# Patient Record
Sex: Male | Born: 1985 | Race: White | Hispanic: No | Marital: Single | Smoking: Current every day smoker
Health system: Southern US, Community
[De-identification: ages and names within clinical notes are randomized; demographics above are authoritative.]

## PROBLEM LIST (undated history)

## (undated) DIAGNOSIS — F429 Obsessive-compulsive disorder, unspecified: Secondary | ICD-10-CM

## (undated) DIAGNOSIS — R45851 Suicidal ideations: Secondary | ICD-10-CM

## (undated) DIAGNOSIS — M1990 Unspecified osteoarthritis, unspecified site: Secondary | ICD-10-CM

## (undated) DIAGNOSIS — R569 Unspecified convulsions: Secondary | ICD-10-CM

## (undated) DIAGNOSIS — F909 Attention-deficit hyperactivity disorder, unspecified type: Secondary | ICD-10-CM

## (undated) DIAGNOSIS — F32A Depression, unspecified: Secondary | ICD-10-CM

## (undated) DIAGNOSIS — F329 Major depressive disorder, single episode, unspecified: Secondary | ICD-10-CM

## (undated) DIAGNOSIS — F1910 Other psychoactive substance abuse, uncomplicated: Secondary | ICD-10-CM

## (undated) DIAGNOSIS — G709 Myoneural disorder, unspecified: Secondary | ICD-10-CM

## (undated) HISTORY — DX: Unspecified osteoarthritis, unspecified site: M19.90

## (undated) HISTORY — DX: Other psychoactive substance abuse, uncomplicated: F19.10

## (undated) HISTORY — DX: Unspecified convulsions: R56.9

## (undated) HISTORY — DX: Myoneural disorder, unspecified: G70.9

---

## 2015-08-20 ENCOUNTER — Ambulatory Visit (INDEPENDENT_AMBULATORY_CARE_PROVIDER_SITE_OTHER): Payer: Self-pay

## 2015-08-20 ENCOUNTER — Ambulatory Visit (INDEPENDENT_AMBULATORY_CARE_PROVIDER_SITE_OTHER): Payer: Self-pay | Admitting: Urgent Care

## 2015-08-20 ENCOUNTER — Encounter (HOSPITAL_COMMUNITY): Payer: Self-pay | Admitting: Emergency Medicine

## 2015-08-20 VITALS — BP 126/98 | HR 103 | Temp 97.9°F | Resp 16 | Ht 73.25 in | Wt 179.0 lb

## 2015-08-20 DIAGNOSIS — F17210 Nicotine dependence, cigarettes, uncomplicated: Secondary | ICD-10-CM | POA: Insufficient documentation

## 2015-08-20 DIAGNOSIS — M1990 Unspecified osteoarthritis, unspecified site: Secondary | ICD-10-CM | POA: Insufficient documentation

## 2015-08-20 DIAGNOSIS — M7989 Other specified soft tissue disorders: Secondary | ICD-10-CM | POA: Insufficient documentation

## 2015-08-20 DIAGNOSIS — Z789 Other specified health status: Secondary | ICD-10-CM

## 2015-08-20 DIAGNOSIS — M79631 Pain in right forearm: Secondary | ICD-10-CM | POA: Insufficient documentation

## 2015-08-20 DIAGNOSIS — M79642 Pain in left hand: Secondary | ICD-10-CM

## 2015-08-20 DIAGNOSIS — Z880 Allergy status to penicillin: Secondary | ICD-10-CM | POA: Insufficient documentation

## 2015-08-20 DIAGNOSIS — M25531 Pain in right wrist: Secondary | ICD-10-CM

## 2015-08-20 DIAGNOSIS — F17200 Nicotine dependence, unspecified, uncomplicated: Secondary | ICD-10-CM

## 2015-08-20 DIAGNOSIS — R202 Paresthesia of skin: Secondary | ICD-10-CM | POA: Insufficient documentation

## 2015-08-20 DIAGNOSIS — Z7289 Other problems related to lifestyle: Secondary | ICD-10-CM

## 2015-08-20 DIAGNOSIS — R740 Nonspecific elevation of levels of transaminase and lactic acid dehydrogenase [LDH]: Secondary | ICD-10-CM | POA: Insufficient documentation

## 2015-08-20 DIAGNOSIS — Z8669 Personal history of other diseases of the nervous system and sense organs: Secondary | ICD-10-CM | POA: Insufficient documentation

## 2015-08-20 LAB — POCT CBC
GRANULOCYTE PERCENT: 69.4 % (ref 37–80)
HEMATOCRIT: 47.8 % (ref 43.5–53.7)
Hemoglobin: 16.6 g/dL (ref 14.1–18.1)
Lymph, poc: 1.8 (ref 0.6–3.4)
MCH: 36.2 pg — AB (ref 27–31.2)
MCHC: 34.7 g/dL (ref 31.8–35.4)
MCV: 104.3 fL — AB (ref 80–97)
MID (CBC): 0.4 (ref 0–0.9)
MPV: 7 fL (ref 0–99.8)
POC GRANULOCYTE: 5.1 (ref 2–6.9)
POC LYMPH %: 24.5 % (ref 10–50)
POC MID %: 6.1 % (ref 0–12)
Platelet Count, POC: 182 10*3/uL (ref 142–424)
RBC: 4.58 M/uL — AB (ref 4.69–6.13)
RDW, POC: 14.4 %
WBC: 7.3 10*3/uL (ref 4.6–10.2)

## 2015-08-20 MED ORDER — IBUPROFEN 400 MG PO TABS
400.0000 mg | ORAL_TABLET | Freq: Once | ORAL | Status: AC
  Administered 2015-08-20: 400 mg via ORAL

## 2015-08-20 MED ORDER — IBUPROFEN 400 MG PO TABS
ORAL_TABLET | ORAL | Status: AC
  Filled 2015-08-20: qty 1

## 2015-08-20 NOTE — Patient Instructions (Addendum)
Because you received an x-ray today, you will receive an invoice from [facility]. Please contact [facility] at [phone] with questions or concerns regarding your invoice. Our billing staff will not be able to assist you with those questions.   Please report to the [facility] ED for emergent evaluation of your hand swelling. The most important thing to rule out would be compartment syndrome which be due to infection versus inflammation or angioedema.

## 2015-08-20 NOTE — ED Notes (Signed)
Pt from [facility] for eval of swelling to left hand that started this morning and also swelling to right forearm. Pt denies any IV drug use. Pt denies n/v/d or fevers at this time. Pt states he burns his hands a lot at work. reddness and swelling noted to pt right hand. Pulses present.

## 2015-08-20 NOTE — Progress Notes (Signed)
MRN: [id number] DOB: 1986/06/20  Subjective:   Jerry Gray is a 30 y.o. male presenting for chief complaint of hand swelling and Rash  Reports 1 day history of worsening left hand pain, swelling and redness. Also has right forearm pain and swelling. Patient cannot recall any trauma but admits that he frequently gets small abrasions, cuts, burns at work; works at a [employer]. Denies fever, streaking of redness, animal bites, n/v, abdominal pain, chest pain. Denies history of known infection with MRSA. He does have tattoos on his forearms but none are recent. Smokes 1/2-1 ppd, drinks 2-6 beers per day.  Jerry Gray has a current medication list which includes the following prescription(s): clonazepam and zolpidem. Also is allergic to ciprofloxacin and penicillins.  Jerry Gray  has a past medical history of Arthritis; Substance abuse; Seizures (HCC); and Neuromuscular disorder (HCC). Also  has no past surgical history on file.  Mother has a history of rheumatoid arthritis.  Objective:   Vitals: BP 126/98 mmHg  Pulse 103  Temp(Src) 97.9 F (36.6 C)  Resp 16  Ht 6' 1.25" (1.861 m)  Wt 179 lb (81.194 kg)  BMI 23.44 kg/m2  SpO2 98%  Physical Exam  Constitutional: He is oriented to person, place, and time. He appears well-developed and well-nourished.  HENT:  Mouth/Throat: Oropharynx is clear and moist.  Eyes: No scleral icterus.  Cardiovascular: Normal rate, regular rhythm and intact distal pulses.  Exam reveals no gallop and no friction rub.   No murmur heard. Pulmonary/Chest: No respiratory distress. He has no wheezes. He has no rales.  Musculoskeletal:       Right forearm: He exhibits tenderness (over area depicted) and swelling (over area depicted). He exhibits no bony tenderness, no deformity and no laceration.       Arms:      Left hand: He exhibits decreased range of motion (flexion), tenderness (worst over radial aspect of hand near base of thumb) and swelling (over area depicted). He  exhibits no bony tenderness, normal capillary refill, no deformity and no laceration. Normal sensation noted. Normal strength noted.       Hands: Left 2nd-5th left digits have pallor.  Lymphadenopathy:    He has no cervical adenopathy.  Neurological: He is alert and oriented to person, place, and time.   Dg Forearm Right  08/20/2015  CLINICAL DATA:  Right forearm pain EXAM: RIGHT FOREARM - 2 VIEW COMPARISON:  None in PACs FINDINGS: The bones of the right forearm are adequately mineralized. There is no acute fracture nor dislocation. There is no lytic nor blastic lesion. The soft tissues are unremarkable. IMPRESSION: There is no acute bony or soft tissue abnormality of the right forearm. Electronically Signed   By: David  [place] M.D.   On: 08/20/2015 14:04   Dg Hand Complete Left  08/20/2015  CLINICAL DATA:  Left hand pain and swelling and erythema ; no report of injury. EXAM: LEFT HAND - COMPLETE 3+ VIEW COMPARISON:  None in PACs FINDINGS: The bones of the left hand are adequately mineralized. The joint spaces are preserved. There is no acute fracture nor dislocation. There is no lytic nor blastic lesion. The observed portions of the carpal bones and distal radius and ulna appear normal. There is soft tissue swelling over the dorsum of the hand. No foreign bodies or soft tissue gas collections are observed. IMPRESSION: There is no acute or chronic bony abnormality of the left hand. There is soft tissue swellingover the carpal bones especially dorsally. Electronically Signed  By: David  [place] M.D.   On: 08/20/2015 14:03   Results for orders placed or performed in visit on 08/20/15 (from the past 24 hour(s))  POCT CBC     Status: Abnormal   Collection Time: 08/20/15  1:38 PM  Result Value Ref Range   WBC 7.3 4.6 - 10.2 K/uL   Lymph, poc 1.8 0.6 - 3.4   POC LYMPH PERCENT 24.5 10 - 50 %L   MID (cbc) 0.4 0 - 0.9   POC MID % 6.1 0 - 12 %M   POC Granulocyte 5.1 2 - 6.9   Granulocyte percent 69.4 37  - 80 %G   RBC 4.58 (A) 4.69 - 6.13 M/uL   Hemoglobin 16.6 14.1 - 18.1 g/dL   HCT, POC 16.1 09.6 - 53.7 %   MCV 104.3 (A) 80 - 97 fL   MCH, POC 36.2 (A) 27 - 31.2 pg   MCHC 34.7 31.8 - 35.4 g/dL   RDW, POC 04.5 %   Platelet Count, POC 182 142 - 424 K/uL   MPV 7.0 0 - 99.8 fL   Assessment and Plan :   This case was precepted with Dr. Cleta Alberts and Dr. Alwyn Ren.  1. Swelling of left hand 2. Left hand pain 3. Forearm joint pain, right 4. Pain and swelling of right forearm - Symptoms concerning for compartment syndrome secondary to infectious versus inflammatory process versus angioedema. Will send to hand specialist for emergent consult. Fax notes to Dr. Amanda Pea at [phone].  5. Tobacco use disorder 6. Alcohol use (HCC) - Cmet pending  Wallis Bamberg, PA-C [facility] [phone] 08/20/2015 1:35 PM

## 2015-08-21 ENCOUNTER — Emergency Department (HOSPITAL_COMMUNITY)
Admission: EM | Admit: 2015-08-21 | Discharge: 2015-08-21 | Disposition: A | Discharge status: Home / Self Care | Payer: Self-pay | Attending: Emergency Medicine | Admitting: Emergency Medicine

## 2015-08-21 ENCOUNTER — Emergency Department (HOSPITAL_COMMUNITY): Payer: Self-pay

## 2015-08-21 ENCOUNTER — Encounter (HOSPITAL_COMMUNITY): Payer: Self-pay | Admitting: Radiology

## 2015-08-21 DIAGNOSIS — M7989 Other specified soft tissue disorders: Secondary | ICD-10-CM

## 2015-08-21 DIAGNOSIS — M79642 Pain in left hand: Secondary | ICD-10-CM

## 2015-08-21 DIAGNOSIS — R7401 Elevation of levels of liver transaminase levels: Secondary | ICD-10-CM

## 2015-08-21 DIAGNOSIS — R740 Nonspecific elevation of levels of transaminase and lactic acid dehydrogenase [LDH]: Secondary | ICD-10-CM

## 2015-08-21 LAB — COMPREHENSIVE METABOLIC PANEL
ALBUMIN: 4.2 g/dL (ref 3.6–5.1)
ALK PHOS: 82 U/L (ref 40–115)
ALT: 239 U/L — ABNORMAL HIGH (ref 9–46)
AST: 559 U/L — AB (ref 10–40)
BILIRUBIN TOTAL: 1.1 mg/dL (ref 0.2–1.2)
BUN: 11 mg/dL (ref 7–25)
CALCIUM: 8.8 mg/dL (ref 8.6–10.3)
CO2: 28 mmol/L (ref 20–31)
Chloride: 97 mmol/L — ABNORMAL LOW (ref 98–110)
Creat: 0.7 mg/dL (ref 0.60–1.35)
Glucose, Bld: 77 mg/dL (ref 65–99)
POTASSIUM: 3.9 mmol/L (ref 3.5–5.3)
Sodium: 135 mmol/L (ref 135–146)
Total Protein: 6.9 g/dL (ref 6.1–8.1)

## 2015-08-21 MED ORDER — OXYCODONE-ACETAMINOPHEN 5-325 MG PO TABS
1.0000 | ORAL_TABLET | Freq: Once | ORAL | Status: AC
  Administered 2015-08-21: 1 via ORAL
  Filled 2015-08-21: qty 1

## 2015-08-21 MED ORDER — OXYCODONE HCL 5 MG PO TABS: 5.0000 mg | ORAL_TABLET | ORAL | Status: DC | PRN

## 2015-08-21 MED ORDER — IOHEXOL 300 MG/ML  SOLN
100.0000 mL | Freq: Once | INTRAMUSCULAR | Status: AC | PRN
  Administered 2015-08-21: 100 mL via INTRAVENOUS

## 2015-08-21 NOTE — ED Notes (Signed)
Glick, MD at bedside.  

## 2015-08-21 NOTE — ED Provider Notes (Signed)
CSN: [id number]     Arrival date & time 08/20/15  1729 History  By signing my name below, I, Jerry Gray, attest that this documentation has been prepared under the direction and in the presence of Jerry Booze, MD . Electronically Signed: Marisue Gray, Scribe. 08/21/2015. 2:46 AM.   Chief Complaint  Patient presents with  . Arm Swelling   The history is provided by the patient. No language interpreter was used.   HPI Comments:  Jerry Gray is a 30 y.o. male referred by [facility] who presents to the Emergency Department complaining of worsening left hand and forearm swelling and 8/10 pain onset two days ago upon waking. He notes the swelling limits his range of motion and strength. Pt also reports tingling in right fingers and soreness in right forearm. Pt notes pain is worse when sitting still. He states he burnt his left hand on a grill a week ago; he popped the resulting blister and noticed clear liquid drainage. Pt has been applying ice to the left hand with mild relief; pt took Tylenol and Ibuprofen yesterday with mild relief. Pt reports smoking cigarettes 1 pack/day, drinking 3-4 beers per day, and using marajuana. Pt denies other drug use. Pt denies fever, chills, sweating, and any other symptoms at this time.   Past Medical History  Diagnosis Date  . Arthritis   . Substance abuse   . Seizures (HCC)   . Neuromuscular disorder (HCC)    History reviewed. No pertinent past surgical history. No family history on file. Social History  Substance Use Topics  . Smoking status: Current Every Day Smoker  . Smokeless tobacco: None  . Alcohol Use: 0.0 oz/week    0 Standard drinks or equivalent per week    Review of Systems  Constitutional: Negative for chills, diaphoresis and fatigue.  Musculoskeletal: Positive for joint swelling and arthralgias.  Neurological: Positive for weakness and numbness.  All other systems reviewed and are negative.  Allergies  Ciprofloxacin and  Penicillins  Home Medications   Prior to Admission medications   Medication Sig Start Date End Date Taking? Authorizing Provider  clonazePAM (KLONOPIN) 1 MG tablet Take 1 mg by mouth 3 (three) times daily as needed for anxiety.    Yes Historical Provider, MD  ibuprofen (ADVIL,MOTRIN) 200 MG tablet Take 400-800 mg by mouth every 6 (six) hours as needed for moderate pain.   Yes Historical Provider, MD  zolpidem (AMBIEN) 10 MG tablet Take 10 mg by mouth at bedtime as needed for sleep.   Yes Historical Provider, MD   BP 131/102 mmHg  Pulse 63  Temp(Src) 98 F (36.7 C) (Oral)  Resp 16  SpO2 92% Physical Exam  Constitutional: He is oriented to person, place, and time. He appears well-developed and well-nourished. No distress.  HENT:  Head: Normocephalic and atraumatic.  Right Ear: Hearing normal.  Left Ear: Hearing normal.  Mouth/Throat: Oropharynx is clear and moist and mucous membranes are normal.  Eyes: Conjunctivae and EOM are normal. Pupils are equal, round, and reactive to light.  Neck: Normal range of motion. Neck supple. No JVD present.  Cardiovascular: S1 normal and S2 normal.  Exam reveals no gallop and no friction rub.   No murmur heard. Pulmonary/Chest: Effort normal and breath sounds normal. He has no wheezes. He has no rales. He exhibits no tenderness.  Abdominal: Soft. Normal appearance and bowel sounds are normal. He exhibits no distension and no mass. There is no hepatosplenomegaly. There is no tenderness. There is no tenderness  at McBurney's point and negative Murphy's sign. No hernia.  Musculoskeletal: He exhibits edema.  Left hand is moderately swollen diffusely with swelling worst over thenar; hand is sligtly cool with slight pallor but prompt capilary refill; full passive ROM; mild pain with flexion and extention of left thumb  Lymphadenopathy:    He has no cervical adenopathy.  Neurological: He is alert and oriented to person, place, and time. He has normal strength.  No cranial nerve deficit or sensory deficit. He exhibits normal muscle tone. Coordination normal. GCS eye subscore is 4. GCS verbal subscore is 5. GCS motor subscore is 6.  Skin: Skin is warm, dry and intact. No rash noted. No cyanosis.  Psychiatric: He has a normal mood and affect. His speech is normal and behavior is normal. Judgment and thought content normal.  Nursing note and vitals reviewed.  ED Course  Procedures  DIAGNOSTIC STUDIES:  Oxygen Saturation is 100% on RA, normal by my interpretation.    COORDINATION OF CARE:  12:44 AM Will order CT and administer pain medication. Discussed treatment plan with pt at bedside and pt agreed to plan.  Labs Review Labs Reviewed - No data to display  Imaging Review Dg Forearm Right  08/20/2015  CLINICAL DATA:  Right forearm pain EXAM: RIGHT FOREARM - 2 VIEW COMPARISON:  None in PACs FINDINGS: The bones of the right forearm are adequately mineralized. There is no acute fracture nor dislocation. There is no lytic nor blastic lesion. The soft tissues are unremarkable. IMPRESSION: There is no acute bony or soft tissue abnormality of the right forearm. Electronically Signed   By: Aeric Burnham  [place] M.D.   On: 08/20/2015 14:04   Ct Hand Left W Contrast  08/21/2015  CLINICAL DATA:  Burned left hand on grill one week ago, with left hand swelling and tingling. Assess for cellulitis or compartment syndrome. Initial encounter. EXAM: CT OF THE LEFT HAND WITHOUT CONTRAST TECHNIQUE: Multidetector CT imaging of the left hand was performed according to the standard protocol. Multiplanar CT image reconstructions were also generated. COMPARISON:  Left hand radiographs performed 08/20/2015 FINDINGS: Mild soft tissue swelling is noted about the hand, Gray prominent dorsally. No focal fluid collection is seen to suggest abscess. The visualized flexor and extensor tendons are grossly unremarkable in appearance. The carpal tunnel is grossly unremarkable in appearance. The  visualized vasculature is unremarkable in appearance. There is no evidence of fracture. No osseous erosion is seen. No definite muscle abnormalities are seen to suggest compartment syndrome, though this tends to be a clinical diagnosis, and is often not visible on CT. IMPRESSION: Mild soft tissue swelling about the hand, Gray prominent dorsally. No evidence of abscess. No definite muscle abnormality seen to suggest compartment syndrome, though this tends to be a clinical diagnosis, and is often not visible on CT. Electronically Signed   By: Roanna Raider M.D.   On: 08/21/2015 02:38   Dg Hand Complete Left  08/20/2015  CLINICAL DATA:  Left hand pain and swelling and erythema ; no report of injury. EXAM: LEFT HAND - COMPLETE 3+ VIEW COMPARISON:  None in PACs FINDINGS: The bones of the left hand are adequately mineralized. The joint spaces are preserved. There is no acute fracture nor dislocation. There is no lytic nor blastic lesion. The observed portions of the carpal bones and distal radius and ulna appear normal. There is soft tissue swelling over the dorsum of the hand. No foreign bodies or soft tissue gas collections are observed. IMPRESSION: There is no  acute or chronic bony abnormality of the left hand. There is soft tissue swellingover the carpal bones especially dorsally. Electronically Signed   By: Arben Packman  [place] M.D.   On: 08/20/2015 14:03   I have personally reviewed and evaluated these images and lab results as part of my medical decision-making.  MDM   Final diagnoses:  Swelling of left hand  Pain of left hand  Elevated transaminase level    Swelling of left hand of uncertain cause. No evidence of vascular compromise with prompt capillary refill. He was sent for CT to look for evidence of any underlying process and this is unremarkable. Old records are reviewed and he was seen at urgent care prior to coming to the ED. He was specifically sent here. Exam shows no evidence of compartment  syndrome. Laboratory workup is significant for significant elevation of transaminases which is most likely related to his history of excessive alcohol intake. Patient is advised to stop drinking. [facility] for further evaluation of his elevated transaminases and he is referred to hand surgery for follow-up of his hand swelling. He is discharged with prescription for oxycodone.  I personally performed the services described in this documentation, which was scribed in my presence. The recorded information has been reviewed and is accurate.      Jerry Booze, MD 08/21/15 [phone]

## 2015-08-21 NOTE — Discharge Instructions (Signed)
Call the hand specialist in the morning to get a an appointment as soon as possible. Return if symptoms are getting worse.  Stop drinking. It is harming your liver.   Oxycodone tablets or capsules What is this medicine? OXYCODONE (ox i KOE done) is a pain reliever. It is used to treat moderate to severe pain. This medicine may be used for other purposes; ask your health care provider or pharmacist if you have questions. What should I tell my health care provider before I take this medicine? They need to know if you have any of these conditions: -Addison's disease -brain tumor -head injury -heart disease -history of drug or alcohol abuse problem -if you often drink alcohol -kidney disease -liver disease -lung or breathing disease, like asthma -mental illness -pancreatic disease -seizures -thyroid disease -an unusual or allergic reaction to oxycodone, codeine, hydrocodone, morphine, other medicines, foods, dyes, or preservatives -pregnant or trying to get pregnant -breast-feeding How should I use this medicine? Take this medicine by mouth with a glass of water. Follow the directions on the prescription label. You can take it with or without food. If it upsets your stomach, take it with food. Take your medicine at regular intervals. Do not take it more often than directed. Do not stop taking except on your doctor's advice. Some brands of this medicine, like Oxecta, have special instructions. Ask your doctor or pharmacist if these directions are for you: Do not cut, crush or chew this medicine. Swallow only one tablet at a time. Do not wet, soak, or lick the tablet before you take it. Talk to your pediatrician regarding the use of this medicine in children. Special care may be needed. Overdosage: If you think you have taken too much of this medicine contact a poison control center or emergency room at once. NOTE: This medicine is only for you. Do not share this medicine with others. What  if I miss a dose? If you miss a dose, take it as soon as you can. If it is almost time for your next dose, take only that dose. Do not take double or extra doses. What may interact with this medicine? -alcohol -antihistamines -certain medicines used for nausea like chlorpromazine, droperidol -erythromycin -ketoconazole -medicines for depression, anxiety, or psychotic disturbances -medicines for sleep -muscle relaxants -naloxone -naltrexone -narcotic medicines (opiates) for pain -nilotinib -phenobarbital -phenytoin -rifampin -ritonavir -voriconazole This list may not describe all possible interactions. Give your health care provider a list of all the medicines, herbs, non-prescription drugs, or dietary supplements you use. Also tell them if you smoke, drink alcohol, or use illegal drugs. Some items may interact with your medicine. What should I watch for while using this medicine? Tell your doctor or health care professional if your pain does not go away, if it gets worse, or if you have new or a different type of pain. You may develop tolerance to the medicine. Tolerance means that you will need a higher dose of the medicine for pain relief. Tolerance is normal and is expected if you take this medicine for a long time. Do not suddenly stop taking your medicine because you may develop a severe reaction. Your body becomes used to the medicine. This does NOT mean you are addicted. Addiction is a behavior related to getting and using a drug for a non-medical reason. If you have pain, you have a medical reason to take pain medicine. Your doctor will tell you how much medicine to take. If your doctor wants you to  stop the medicine, the dose will be slowly lowered over time to avoid any side effects. You may get drowsy or dizzy when you first start taking this medicine or change doses. Do not drive, use machinery, or do anything that may be dangerous until you know how the medicine affects you.  Stand or sit up slowly. There are different types of narcotic medicines (opiates) for pain. If you take more than one type at the same time, you may have more side effects. Give your health care provider a list of all medicines you use. Your doctor will tell you how much medicine to take. Do not take more medicine than directed. Call emergency for help if you have problems breathing. This medicine will cause constipation. Try to have a bowel movement at least every 2 to 3 days. If you do not have a bowel movement for 3 days, call your doctor or health care professional. Your mouth may get dry. Drinking water, chewing sugarless gum, or sucking on hard candy may help. See your dentist every 6 months. What side effects may I notice from receiving this medicine? Side effects that you should report to your doctor or health care professional as soon as possible: -allergic reactions like skin rash, itching or hives, swelling of the face, lips, or tongue -breathing problems -confusion -feeling faint or lightheaded, falls -trouble passing urine or change in the amount of urine -unusually weak or tired Side effects that usually do not require medical attention (report to your doctor or health care professional if they continue or are bothersome): -constipation -dry mouth -itching -nausea, vomiting -upset stomach This list may not describe all possible side effects. Call your doctor for medical advice about side effects. You may report side effects to FDA at 1-800-FDA-1088. Where should I keep my medicine? Keep out of the reach of children. This medicine can be abused. Keep your medicine in a safe place to protect it from theft. Do not share this medicine with anyone. Selling or giving away this medicine is dangerous and against the law. Store at room temperature between 15 and 30 degrees C (59 and 86 degrees F). Protect from light. Keep container tightly closed. This medicine may cause accidental  overdose and death if it is taken by other adults, children, or pets. Flush any unused medicine down the toilet to reduce the chance of harm. Do not use the medicine after the expiration date. NOTE: This sheet is a summary. It may not cover all possible information. If you have questions about this medicine, talk to your doctor, pharmacist, or health care provider.    2016, [employer] Standard. (2014-10-28 01:15:14)

## 2015-08-22 ENCOUNTER — Telehealth: Payer: Self-pay

## 2015-08-22 ENCOUNTER — Ambulatory Visit (HOSPITAL_BASED_OUTPATIENT_CLINIC_OR_DEPARTMENT_OTHER)
Admission: RE | Admit: 2015-08-22 | Discharge: 2015-08-22 | Disposition: A | Discharge status: Home / Self Care | Payer: Self-pay | Source: Ambulatory Visit | Attending: Physician Assistant | Admitting: Physician Assistant

## 2015-08-22 ENCOUNTER — Ambulatory Visit (INDEPENDENT_AMBULATORY_CARE_PROVIDER_SITE_OTHER): Payer: Self-pay | Admitting: Family Medicine

## 2015-08-22 VITALS — BP 143/96 | HR 93 | Temp 98.1°F | Resp 16 | Ht 73.0 in | Wt 178.0 lb

## 2015-08-22 DIAGNOSIS — R2231 Localized swelling, mass and lump, right upper limb: Secondary | ICD-10-CM

## 2015-08-22 DIAGNOSIS — M79631 Pain in right forearm: Secondary | ICD-10-CM

## 2015-08-22 DIAGNOSIS — R945 Abnormal results of liver function studies: Secondary | ICD-10-CM

## 2015-08-22 DIAGNOSIS — M7989 Other specified soft tissue disorders: Secondary | ICD-10-CM

## 2015-08-22 DIAGNOSIS — R7989 Other specified abnormal findings of blood chemistry: Secondary | ICD-10-CM

## 2015-08-22 DIAGNOSIS — M79632 Pain in left forearm: Secondary | ICD-10-CM

## 2015-08-22 DIAGNOSIS — R2232 Localized swelling, mass and lump, left upper limb: Secondary | ICD-10-CM

## 2015-08-22 DIAGNOSIS — F1010 Alcohol abuse, uncomplicated: Secondary | ICD-10-CM

## 2015-08-22 LAB — POCT SEDIMENTATION RATE: POCT SED RATE: 4 mm/hr (ref 0–22)

## 2015-08-22 NOTE — Patient Instructions (Signed)
Go to [facility] and register at radiology for outpatient US venous doppler.

## 2015-08-22 NOTE — Progress Notes (Signed)
[facility] [address] [place] [address] [phone]  Date:  08/22/2015   Name:  Jerry Gray   DOB:  1985/07/18   MRN:  [id number]  PCP:  Default, Provider, MD    Chief Complaint: Follow-up   History of Present Illness:  This is a 30 y.o. male with PMH alcohol abuse who is presenting with 3 days of left and right hand/forearm swelling. States he woke 3 days ago with a very large left hand. Right hand moderately swollen as well but that went down. Was having pain initially, this is mostly gone now. Now left hand is feeling numb, esp at thumb. He was seen here 2/20 with concern of compartment syndrome vs inflammatory process vs angioedema. CBC wnl and Cmet with markedly increased LFTs - AST 559 and ALT 239. He was sent to ED. Xray left forearm negative. CT left hand negative. Discharged with oxycodone and referral to hand surgery. It has been about 24 hours since discharge -- he states the swelling has resolved in right hand. It is going down some in left hand. He feels he is getting some of his ROM back in his digits. He noticed a new swelling of his left forearm. He states his right forearm feels very stiff.  No hx blood clot. No fam hx blood clot. No recent travel or immobilization.  Pt states prior to his symptoms beginning he was "going hard" with friends who were in town. He states at baseline he drinks 1 bottle of wine and 2 beers per day. He states he used to drink a fifth of liquor per day but has cut back. He smokes marijuana on a regular basis. No other recent drug use. He states he has used IV drugs about 25 times before, last used 1 year ago. He has a lot of tattoos, most of which he got "at people's houses". He states he got hepatitis series in recent past d/t getting tattoos at people's houses instead of recognized tattoo parlors.   Pt is very worried about finances. He is uninsured.   Review of Systems:  Review of Systems See HPI  There are no  active problems to display for this patient.   Prior to Admission medications   Medication Sig Start Date End Date Taking? Authorizing Provider  clonazePAM (KLONOPIN) 1 MG tablet Take 1 mg by mouth 3 (three) times daily as needed for anxiety.    Yes Historical Provider, MD  ibuprofen (ADVIL,MOTRIN) 200 MG tablet Take 400-800 mg by mouth every 6 (six) hours as needed for moderate pain.   Yes Historical Provider, MD  oxyCODONE (ROXICODONE) 5 MG immediate release tablet Take 1-2 tablets (5-10 mg total) by mouth every 4 (four) hours as needed for moderate pain or severe pain. 08/21/15  Yes Dione Booze, MD  zolpidem (AMBIEN) 10 MG tablet Take 10 mg by mouth at bedtime as needed for sleep.   Yes Historical Provider, MD    Allergies  Allergen Reactions  . Ciprofloxacin Diarrhea and Nausea And Vomiting  . Penicillins Anaphylaxis    History reviewed. No pertinent past surgical history.  Social History  Substance Use Topics  . Smoking status: Current Every Day Smoker  . Smokeless tobacco: None  . Alcohol Use: 0.0 oz/week    0 Standard drinks or equivalent per week    History reviewed. No pertinent family history.  Medication list has been reviewed and updated.  Physical Examination:  Physical Exam  Constitutional: He is oriented  to person, place, and time. He appears well-developed and well-nourished. No distress.  HENT:  Head: Normocephalic and atraumatic.  Right Ear: Hearing normal.  Left Ear: Hearing normal.  Nose: Nose normal.  Eyes: Conjunctivae and lids are normal. Right eye exhibits no discharge. Left eye exhibits no discharge. No scleral icterus. Right eye exhibits nystagmus. Left eye exhibits nystagmus.  Cardiovascular: Normal rate, regular rhythm, normal heart sounds and normal pulses.   No murmur heard. Pulmonary/Chest: Effort normal and breath sounds normal. No respiratory distress. He has no wheezes. He has no rhonchi. He has no rales.  Musculoskeletal:       Right  shoulder: Normal.       Left shoulder: Normal.       Right elbow: Normal.      Left elbow: Normal.       Right wrist: Normal.       Left wrist: He exhibits normal range of motion and no tenderness.       Right upper arm: Normal.       Left upper arm: Normal.       Right forearm: He exhibits swelling (measures 30 cm. Forearm is tense and firm). He exhibits no tenderness.       Left forearm: He exhibits swelling (mild, dorsal forearm. Measures 28 cm). He exhibits no tenderness.       Right hand: Normal.       Left hand: He exhibits decreased range of motion (decreased flexion and extension of digits d/t swelling) and swelling (entire hand swelling, stops at wrist.). He exhibits no tenderness and normal capillary refill. Decreased sensation (thumb) noted. Decreased strength noted. He exhibits thumb/finger opposition.  4/5 left wrist strength with extension and flexion 5/5 right wrist strength Unable to touch left 4th and 5th digits to thumb. Decreased strength with finger opposition, 2-3/5.  Lymphadenopathy:       Head (right side): No submental, no submandibular and no tonsillar adenopathy present.       Head (left side): No submental, no submandibular and no tonsillar adenopathy present.    He has no cervical adenopathy.    He has no axillary adenopathy.  Neurological: He is alert and oriented to person, place, and time.  Skin: Skin is warm, dry and intact. No lesion and no rash noted.  No skin changes  Psychiatric: He has a normal mood and affect. His speech is normal and behavior is normal. Thought content normal.   BP 143/96 mmHg  Pulse 93  Temp(Src) 98.1 F (36.7 C) (Oral)  Resp 16  Ht  (1.854 m)  Wt 178 lb (80.74 kg)  BMI 23.49 kg/m2  SpO2 99%   Results for orders placed or performed in visit on 08/22/15  POCT SEDIMENTATION RATE  Result Value Ref Range   POCT SED RATE 4 0 - 22 mm/hr   BILATERAL UPPER EXT DOPPLER IMPRESSION: No evidence of deep venous thrombosis in  either upper extremity.  Heterogeneous masslike lesion in the right mid forearm. Although this is incompletely evaluated on this exam, it measures approximately 9 x 3 x 5 cm and does show some internal blood flow on color Doppler ultrasound. Differential diagnosis includes neoplasm, hematoma comment abscess. Recommend clinical correlation, and consider upper extremity MRI without and with contrast for further evaluation if clinically warranted.  Assessment and Plan:  1. Pain and swelling of right forearm 2. Swelling of left hand 3. Mass of forearm, right Swelling concerning for possible DVT -- upper ext doppler showed no  clots but did show a large mass in right forearm. Pt sent for MR of right arm for further eval. Sed rate in normal range. - US Venous Img Upper Bilat; Future - MR Forearm Right Wo/W Cm; Future - POCT SEDIMENTATION RATE - CBC - C-reactive protein - Hepatic function panel  4. Alcohol abuse 5. Elevated LFTs We discussed the importance of alcohol cessation d/t significant inflammation of liver. Advised getting tested for hep b or hep c but patient declined at this time d/t financial concerns.   Roswell Miners Dyke Brackett, MHS [facility]  08/24/2015

## 2015-08-22 NOTE — Telephone Encounter (Signed)
[facility] in [facility] is calling because they just spoke with someone about patient getting an ultrasound and wants [place] to know that it won't be until 6:30 today. The lady wanted to tell us before the patient headed over today. Please inform patient!

## 2015-08-23 ENCOUNTER — Ambulatory Visit (HOSPITAL_COMMUNITY)
Admission: RE | Admit: 2015-08-23 | Discharge: 2015-08-23 | Disposition: A | Discharge status: Home / Self Care | Payer: Self-pay | Source: Ambulatory Visit | Attending: Family Medicine | Admitting: Family Medicine

## 2015-08-23 ENCOUNTER — Telehealth: Payer: Self-pay | Admitting: *Deleted

## 2015-08-23 ENCOUNTER — Inpatient Hospital Stay (HOSPITAL_COMMUNITY): Admission: RE | Admit: 2015-08-23 | Payer: Self-pay | Source: Ambulatory Visit

## 2015-08-23 DIAGNOSIS — M6289 Other specified disorders of muscle: Secondary | ICD-10-CM | POA: Insufficient documentation

## 2015-08-23 DIAGNOSIS — R2231 Localized swelling, mass and lump, right upper limb: Secondary | ICD-10-CM | POA: Insufficient documentation

## 2015-08-23 DIAGNOSIS — M7989 Other specified soft tissue disorders: Secondary | ICD-10-CM | POA: Insufficient documentation

## 2015-08-23 DIAGNOSIS — M79631 Pain in right forearm: Secondary | ICD-10-CM | POA: Insufficient documentation

## 2015-08-23 MED ORDER — GADOBENATE DIMEGLUMINE 529 MG/ML IV SOLN
20.0000 mL | Freq: Once | INTRAVENOUS | Status: AC | PRN
  Administered 2015-08-23: 16 mL via INTRAVENOUS

## 2015-08-23 NOTE — Telephone Encounter (Signed)
Was sent to [facility] who was full and then sent to [facility] who can't get him in to 9 pm. MRI tech suspect GI is full today and thinks 9 pm is likely the first MRI open but does not [facility] [place] - please check there and if pt can be seen at a more reasonable time there then call [facility] to cancel the 9 pm slot and let pt know. Thanks. eva

## 2015-08-23 NOTE — Progress Notes (Signed)
Patient ID: Jerry Gray, male   DOB: 02-Mar-1986, 30 y.o.   MRN: [id number] Pt assessed independently, reviewed documentation and agree w/ assessment and plan.  Results for orders placed or performed in visit on 08/22/15  POCT SEDIMENTATION RATE  Result Value Ref Range   POCT SED RATE 4 0 - 22 mm/hr   Dg Forearm Right  08/20/2015  CLINICAL DATA:  Right forearm pain EXAM: RIGHT FOREARM - 2 VIEW COMPARISON:  None in PACs FINDINGS: The bones of the right forearm are adequately mineralized. There is no acute fracture nor dislocation. There is no lytic nor blastic lesion. The soft tissues are unremarkable. IMPRESSION: There is no acute bony or soft tissue abnormality of the right forearm. Electronically Signed   By: David  Sw[place] M.D.   On: 08/20/2015 14:04   Ct Hand Left W Contrast  08/21/2015  CLINICAL DATA:  Burned left hand on grill one week ago, with left hand swelling and tingling. Assess for cellulitis or compartment syndrome. Initial encounter. EXAM: CT OF THE LEFT HAND WITHOUT CONTRAST TECHNIQUE: Multidetector CT imaging of the left hand was performed according to the standard protocol. Multiplanar CT image reconstructions were also generated. COMPARISON:  Left hand radiographs performed 08/20/2015 FINDINGS: Mild soft tissue swelling is noted about the hand, more prominent dorsally. No focal fluid collection is seen to suggest abscess. The visualized flexor and extensor tendons are grossly unremarkable in appearance. The carpal tunnel is grossly unremarkable in appearance. The visualized vasculature is unremarkable in appearance. There is no evidence of fracture. No osseous erosion is seen. No definite muscle abnormalities are seen to suggest compartment syndrome, though this tends to be a clinical diagnosis, and is often not visible on CT. IMPRESSION: Mild soft tissue swelling about the hand, more prominent dorsally. No evidence of abscess. No definite muscle abnormality seen to suggest compartment  syndrome, though this tends to be a clinical diagnosis, and is often not visible on CT. Electronically Signed   By: Roanna Raider M.D.   On: 08/21/2015 02:38   US Venous Img Upper Bilat  08/22/2015  CLINICAL DATA:  Right forearm and left hand swelling and pain for 4 days. EXAM: BILATERAL UPPER EXTREMITY VENOUS DOPPLER ULTRASOUND TECHNIQUE: Gray-scale sonography with graded compression, as well as color Doppler and duplex ultrasound were performed to evaluate the bilateral upper extremity deep venous systems from the level of the subclavian vein and including the jugular, axillary, basilic, radial, ulnar and upper cephalic vein. Spectral Doppler was utilized to evaluate flow at rest and with distal augmentation maneuvers. COMPARISON:  None. FINDINGS: RIGHT UPPER EXTREMITY Internal Jugular Vein: No evidence of thrombus. Normal compressibility, respiratory phasicity and response to augmentation. Subclavian Vein: No evidence of thrombus. Normal compressibility, respiratory phasicity and response to augmentation. Axillary Vein: No evidence of thrombus. Normal compressibility, respiratory phasicity and response to augmentation. Cephalic Vein: No evidence of thrombus. Normal compressibility, respiratory phasicity and response to augmentation. Basilic Vein: No evidence of thrombus. Normal compressibility, respiratory phasicity and response to augmentation. Brachial Veins: No evidence of thrombus. Normal compressibility, respiratory phasicity and response to augmentation. Radial Veins: No evidence of thrombus. Normal compressibility, respiratory phasicity and response to augmentation. Ulnar Veins: No evidence of thrombus. Normal compressibility, respiratory phasicity and response to augmentation. Venous Reflux:  None. Other Findings: In the mid right forearm, there is a heterogeneous mass which was poorly measured in visualized by the sonographer. This measures approximately 9 x 3 x 5 cm, and color Doppler ultrasound  does show some areas  of internal blood flow within this masslike lesion. LEFT UPPER EXTREMITY Internal Jugular Vein: No evidence of thrombus. Normal compressibility, respiratory phasicity and response to augmentation. Subclavian Vein: No evidence of thrombus. Normal compressibility, respiratory phasicity and response to augmentation. Axillary Vein: No evidence of thrombus. Normal compressibility, respiratory phasicity and response to augmentation. Cephalic Vein: No evidence of thrombus. Normal compressibility, respiratory phasicity and response to augmentation. Basilic Vein: No evidence of thrombus. Normal compressibility, respiratory phasicity and response to augmentation. Brachial Veins: No evidence of thrombus. Normal compressibility, respiratory phasicity and response to augmentation. Radial Veins: No evidence of thrombus. Normal compressibility, respiratory phasicity and response to augmentation. Ulnar Veins: No evidence of thrombus. Normal compressibility, respiratory phasicity and response to augmentation. Venous Reflux:  None. Other Findings:  None. IMPRESSION: No evidence of deep venous thrombosis in either upper extremity. Heterogeneous masslike lesion in the right mid forearm. Although this is incompletely evaluated on this exam, it measures approximately 9 x 3 x 5 cm and does show some internal blood flow on color Doppler ultrasound. Differential diagnosis includes neoplasm, hematoma comment abscess. Recommend clinical correlation, and consider upper extremity MRI without and with contrast for further evaluation if clinically warranted. Electronically Signed   By: Myles Rosenthal M.D.   On: 08/22/2015 21:14   Dg Hand Complete Left  08/20/2015  CLINICAL DATA:  Left hand pain and swelling and erythema ; no report of injury. EXAM: LEFT HAND - COMPLETE 3+ VIEW COMPARISON:  None in PACs FINDINGS: The bones of the left hand are adequately mineralized. The joint spaces are preserved. There is no acute fracture  nor dislocation. There is no lytic nor blastic lesion. The observed portions of the carpal bones and distal radius and ulna appear normal. There is soft tissue swelling over the dorsum of the hand. No foreign bodies or soft tissue gas collections are observed. IMPRESSION: There is no acute or chronic bony abnormality of the left hand. There is soft tissue swellingover the carpal bones especially dorsally. Electronically Signed   By: David  [place]and M.D.   On: 08/20/2015 14:03   Stat doppler shows mass in right forearm. Pt was instructed to go to the ER last night and his mother was informed as well, they agreed. Epic shows no ER or other visits since then so will obtain stat upper ext MRI without and with contrast today for further eval of 9x5x3 cm - concern for neoplasm Norberto Sorenson, MD MPH

## 2015-08-23 NOTE — Telephone Encounter (Signed)
??  Pt already had the [place] last night -venous doppler of B upper ext I think at either [facility] or [facility] - that is how we found the mass than now needs MRI'd

## 2015-08-23 NOTE — Telephone Encounter (Signed)
Does patient still need u/s?

## 2015-08-23 NOTE — Telephone Encounter (Signed)
Wait - please disregard this message and close it - pt did get the ultrasound at 6:30 as was indicated yesterday so this message is null and void.

## 2015-08-23 NOTE — Telephone Encounter (Signed)
Pt called back and stated he was on his way at the moment to have the MRI done.  He stated that he was aware of the procedure order.  I advised him to call us once his MRI was over so he could get further details.  He understood.

## 2015-08-23 NOTE — Telephone Encounter (Signed)
Called and left an message for pt to return call regarding setting up an MRI today.

## 2015-08-23 NOTE — Telephone Encounter (Signed)
Called scheduling for [facility] hospitals [facility], [facility], [place], etc) they did not have an opening any sooner today. I also called [facility] outpatient imaging and spoke with MRI tech at the [facility] location and they did not have anything either. Patient notified and voiced understanding.

## 2015-08-24 DIAGNOSIS — R7989 Other specified abnormal findings of blood chemistry: Secondary | ICD-10-CM | POA: Insufficient documentation

## 2015-08-24 DIAGNOSIS — R945 Abnormal results of liver function studies: Secondary | ICD-10-CM

## 2015-08-24 DIAGNOSIS — R2230 Localized swelling, mass and lump, unspecified upper limb: Secondary | ICD-10-CM | POA: Insufficient documentation

## 2015-08-24 DIAGNOSIS — F1010 Alcohol abuse, uncomplicated: Secondary | ICD-10-CM | POA: Insufficient documentation

## 2015-08-25 ENCOUNTER — Telehealth: Payer: Self-pay | Admitting: Family Medicine

## 2015-08-25 NOTE — Telephone Encounter (Signed)
Please call pt and have him come back into clinic today for further eval.  We need to repeat his blood work to make sure his liver is still functioning - repeating blood work will help [place] further determine if this is blood clot in the muscle (hematoma) or cancer.  Need CXR, INR, cmp, cbc, crp, acute hepatitis panel, ck  I hae called [facility] and am waiting to hear back from their on call physician Dr. Kennieth Rad.  Have been old that Dr. Andrey Campanile - orthopedic physician in the oncology dept at [facility] might be an excellent resource in this case.an

## 2015-08-26 ENCOUNTER — Ambulatory Visit (INDEPENDENT_AMBULATORY_CARE_PROVIDER_SITE_OTHER): Payer: Self-pay | Admitting: Family Medicine

## 2015-08-26 ENCOUNTER — Ambulatory Visit (INDEPENDENT_AMBULATORY_CARE_PROVIDER_SITE_OTHER): Payer: Self-pay

## 2015-08-26 VITALS — BP 140/82 | HR 104 | Temp 98.5°F | Resp 12 | Ht 71.0 in | Wt 184.0 lb

## 2015-08-26 DIAGNOSIS — R7989 Other specified abnormal findings of blood chemistry: Secondary | ICD-10-CM

## 2015-08-26 DIAGNOSIS — F17200 Nicotine dependence, unspecified, uncomplicated: Secondary | ICD-10-CM

## 2015-08-26 DIAGNOSIS — R2231 Localized swelling, mass and lump, right upper limb: Secondary | ICD-10-CM

## 2015-08-26 DIAGNOSIS — M79632 Pain in left forearm: Secondary | ICD-10-CM

## 2015-08-26 DIAGNOSIS — M7989 Other specified soft tissue disorders: Secondary | ICD-10-CM

## 2015-08-26 DIAGNOSIS — Z789 Other specified health status: Secondary | ICD-10-CM

## 2015-08-26 DIAGNOSIS — M79642 Pain in left hand: Secondary | ICD-10-CM

## 2015-08-26 DIAGNOSIS — Z7289 Other problems related to lifestyle: Secondary | ICD-10-CM

## 2015-08-26 DIAGNOSIS — R945 Abnormal results of liver function studies: Secondary | ICD-10-CM

## 2015-08-26 DIAGNOSIS — M79631 Pain in right forearm: Secondary | ICD-10-CM

## 2015-08-26 LAB — POCT CBC
GRANULOCYTE PERCENT: 58.9 % (ref 37–80)
HCT, POC: 45.8 % (ref 43.5–53.7)
Hemoglobin: 15.9 g/dL (ref 14.1–18.1)
Lymph, poc: 1.7 (ref 0.6–3.4)
MCH: 36.6 pg — AB (ref 27–31.2)
MCHC: 34.8 g/dL (ref 31.8–35.4)
MCV: 105.2 fL — AB (ref 80–97)
MID (cbc): 0.4 (ref 0–0.9)
MPV: 7.6 fL (ref 0–99.8)
PLATELET COUNT, POC: 193 10*3/uL (ref 142–424)
POC GRANULOCYTE: 3.1 (ref 2–6.9)
POC LYMPH PERCENT: 33 %L (ref 10–50)
POC MID %: 8.1 %M (ref 0–12)
RBC: 4.35 M/uL — AB (ref 4.69–6.13)
RDW, POC: 13.8 %
WBC: 5.3 10*3/uL (ref 4.6–10.2)

## 2015-08-26 LAB — HEPATITIS PANEL, ACUTE
HCV AB: NEGATIVE
HEP B S AG: NEGATIVE
Hep A IgM: NONREACTIVE
Hep B C IgM: NONREACTIVE

## 2015-08-26 LAB — COMPREHENSIVE METABOLIC PANEL
ALT: 129 U/L — ABNORMAL HIGH (ref 9–46)
AST: 110 U/L — ABNORMAL HIGH (ref 10–40)
Albumin: 4.2 g/dL (ref 3.6–5.1)
Alkaline Phosphatase: 66 U/L (ref 40–115)
BUN: 7 mg/dL (ref 7–25)
CHLORIDE: 104 mmol/L (ref 98–110)
CO2: 30 mmol/L (ref 20–31)
Calcium: 9.2 mg/dL (ref 8.6–10.3)
Creat: 0.81 mg/dL (ref 0.60–1.35)
Glucose, Bld: 83 mg/dL (ref 65–99)
POTASSIUM: 4.4 mmol/L (ref 3.5–5.3)
Sodium: 141 mmol/L (ref 135–146)
TOTAL PROTEIN: 6.6 g/dL (ref 6.1–8.1)
Total Bilirubin: 0.4 mg/dL (ref 0.2–1.2)

## 2015-08-26 LAB — POCT SEDIMENTATION RATE: POCT SED RATE: 2 mm/h (ref 0–22)

## 2015-08-26 LAB — PROTIME-INR
INR: 0.89 (ref <1.50)
PROTHROMBIN TIME: 12.1 s (ref 11.6–15.2)

## 2015-08-26 LAB — CK: CK TOTAL: 232 U/L (ref 7–232)

## 2015-08-26 LAB — C-REACTIVE PROTEIN

## 2015-08-26 NOTE — Progress Notes (Addendum)
Subjective:    Patient ID: Jerry Gray, male    DOB: 1985/12/31, 30 y.o.   MRN: [id number] By signing my name below, I, Jerry Gray, attest that this documentation has been prepared under the direction and in the presence of Norberto Sorenson, MD. Electronically Signed: Javier Gray, ER Scribe. 08/26/2015. 12:22 PM.  Chief Complaint  Patient presents with  . Follow-up    left hand swelling and right forearm swellling    HPI HPI Comments: Jerry Gray is a 30 y.o. male who presents to [facility] complaining of continued swelling in his bilateral hands. As the swelling has decreased the pain in his hands have increased. He has been eating normally. His BM have been normal. He denies rashes or swelling in legs. He states he was drinking a fifth of whiskey per day six months ago, and has cut back. He is still drinking too much beer and wine. He denies muscle soreness.   The pt presented weighting 220lb with swelling of bilateral hands, left worse than right, as well as possible masses in right forearm. X-ray and CBC were both normal at that time. Pt was sent to ER to rule out compartment syndrome. CT of left hand was unremarkable in the ER. CBC showed acute hepatitis with transaminases in the 500s, AST greater than ALT. Pt did admit to frequent episodes of binge drinking including the week prior. Swelling continued to progress so the following day he returned to clinic and dopplers were obtained on both upper extremities. Which showed venus vasculature was normal, but two large unidentifiable masses in the right arm. Differential included abscess, hematoma and neoplasm though the technician did note that there seemed to be internal blood flow to the masses and SED rate was normal. MRI of the right forearm was then abtained and masses were still unable ot identified but differential included ocncerns for sarcoma mixoid tumor or a fibroma and radiologist recommended orthopedic oncology evaluation. Spoke with  orthopedist in town, as well as with orthopedist at [facility] and [facility] who want to proceed with MRI imaging of right arm. In the meantime pt has been taken out of work and instructed to keep arms elevated and has reported that sx have significantly improved. He is here today for follow up and additonal labs. Pt has no significant past medical hx other than multi substance abuse.   Last admitted IV drug use 1 yr prior to volar surface of Lt hand only.  Starting abusing EtOH when he was 60-14 yo. Has hand numerous amount of arrests due to substances with sev stints in jail (longest 3 mos).  At times, has had had pretty sig w/d sxs from EtOH - was having to take mult shots of liquor in the morning before he could go to work to stop the tremor and diaphoresis but now etoh and tob use have slowed down some since he  Had to when he had his wisdom teeth removed sev mos ago. Doesn't feel like he needs therapy now - helped for anger issues prior but is passed those - thinks he wouldn't have anything to discuss with a mental health provider. No substance abuse in family. His family is in [place].  No desire to quit at this time - even with the serious current medical comorbidities - since he has fun drinking etoh - really his only past-time and doesn't enjoy activities if he is not inebriated. Drinking is the only thing he looks forward to. He  really would like to be able to cut down EtOH to just 1-2/night or 3 beers but now takes sev cases of beer to get to a suitablly inebriated state. He has never gone to AA and doesn't want to try due to the "person is powerless over themselves" and "higher power" tenements.  Works at a [employer].  Past Medical History  Diagnosis Date  . Arthritis   . Substance abuse   . Seizures (HCC)   . Neuromuscular disorder (HCC)    Allergies  Allergen Reactions  . Ciprofloxacin Diarrhea and Nausea And Vomiting  . Penicillins Anaphylaxis   Current Outpatient Prescriptions  on File Prior to Visit  Medication Sig Dispense Refill  . clonazePAM (KLONOPIN) 1 MG tablet Take 1 mg by mouth 3 (three) times daily as needed for anxiety.     Marland Kitchen ibuprofen (ADVIL,MOTRIN) 200 MG tablet Take 400-800 mg by mouth every 6 (six) hours as needed for moderate pain.    Marland Kitchen oxyCODONE (ROXICODONE) 5 MG immediate release tablet Take 1-2 tablets (5-10 mg total) by mouth every 4 (four) hours as needed for moderate pain or severe pain. 15 tablet 0  . zolpidem (AMBIEN) 10 MG tablet Take 10 mg by mouth at bedtime as needed for sleep.     No current facility-administered medications on file prior to visit.    Review of Systems  Constitutional: Negative for fever and chills.  Cardiovascular: Negative for leg swelling.  Musculoskeletal: Positive for myalgias and joint swelling.  Neurological: Positive for weakness and numbness.      Objective:  BP 140/82 mmHg  Pulse 104  Temp(Src) 98.5 F (36.9 C) (Oral)  Resp 12  Ht  (1.803 m)  Wt 184 lb (83.462 kg)  BMI 25.67 kg/m2  SpO2 97%  Physical Exam  Constitutional: He is oriented to person, place, and time. He appears well-developed and well-nourished. No distress.  HENT:  Head: Normocephalic and atraumatic.  Eyes: Pupils are equal, round, and reactive to light.  Neck: Neck supple.  Cardiovascular: Normal rate.   Pulmonary/Chest: Effort normal. No respiratory distress.  Lungs clear with good air movement.   Musculoskeletal: Normal range of motion.  Pincher strength 4/5 on left, normal on right.   Neurological: He is alert and oriented to person, place, and time. Coordination normal.  Skin: Skin is warm and dry. He is not diaphoretic.  Psychiatric: He has a normal mood and affect. His behavior is normal.  Nursing note and vitals reviewed.    Dg Chest 2 View  08/26/2015  CLINICAL DATA:  Patient with no chest complaints. History of smoking. EXAM: CHEST  2 VIEW COMPARISON:  None. FINDINGS: The heart size and mediastinal contours are  within normal limits. Both lungs are clear. The visualized skeletal structures are unremarkable. IMPRESSION: No active cardiopulmonary disease. Electronically Signed   By: Annia Belt M.D.   On: 08/26/2015 12:39   Dg Forearm Right  08/20/2015  CLINICAL DATA:  Right forearm pain EXAM: RIGHT FOREARM - 2 VIEW COMPARISON:  None in PACs FINDINGS: The bones of the right forearm are adequately mineralized. There is no acute fracture nor dislocation. There is no lytic nor blastic lesion. The soft tissues are unremarkable. IMPRESSION: There is no acute bony or soft tissue abnormality of the right forearm. Electronically Signed   By: David  Swaziland M.D.   On: 08/20/2015 14:04   Ct Hand Left W Contrast  08/21/2015  CLINICAL DATA:  Burned left hand on grill one week ago, with left  hand swelling and tingling. Assess for cellulitis or compartment syndrome. Initial encounter. EXAM: CT OF THE LEFT HAND WITHOUT CONTRAST TECHNIQUE: Multidetector CT imaging of the left hand was performed according to the standard protocol. Multiplanar CT image reconstructions were also generated. COMPARISON:  Left hand radiographs performed 08/20/2015 FINDINGS: Mild soft tissue swelling is noted about the hand, more prominent dorsally. No focal fluid collection is seen to suggest abscess. The visualized flexor and extensor tendons are grossly unremarkable in appearance. The carpal tunnel is grossly unremarkable in appearance. The visualized vasculature is unremarkable in appearance. There is no evidence of fracture. No osseous erosion is seen. No definite muscle abnormalities are seen to suggest compartment syndrome, though this tends to be a clinical diagnosis, and is often not visible on CT. IMPRESSION: Mild soft tissue swelling about the hand, more prominent dorsally. No evidence of abscess. No definite muscle abnormality seen to suggest compartment syndrome, though this tends to be a clinical diagnosis, and is often not visible on CT.  Electronically Signed   By: Roanna Raider M.D.   On: 08/21/2015 02:38   Mr Forearm Right Wo/w Cm  08/24/2015  CLINICAL DATA:  Right forearm and left hand swelling for approximately 4 days. Ultrasound of the right forearm show possible mass on the anterior aspect of the forearm and elbow. Initial encounter. EXAM: MRI OF THE RIGHT FOREARM WITHOUT AND WITH CONTRAST TECHNIQUE: Multiplanar, multisequence MR imaging was performed both before and after administration of intravenous contrast. CONTRAST:  16 mL MULTIHANCE GADOBENATE DIMEGLUMINE 529 MG/ML IV SOLN COMPARISON:  Ultrasound 08/22/2015. FINDINGS: A lesion is identified in the pronator teres muscle. Discrete measurement is not possible but it is approximately 2.3 cm AP x 2.2 cm transverse at a point approximately 6.5 cm below the joint line. The lesion measures approximately 6.5 cm craniocaudal. There are faint areas of edema and enhancement which extend into muscle fibers about the main mass. A lesion is also seen centered in the anconeus muscle at the level of the elbow joint measuring 1.2 cm AP by 1.9 cm transverse by approximately 7.4 cm craniocaudal. Both lesions are T2 hyperintense, slightly hyperintense to muscle on T1 weighted imaging and demonstrate diffuse contrast enhancement. There are areas of similar signal abnormality which extend between the lesion in the pronator teres toward the anconeus and it is possible these 2 lesions are connected or represent extension of tumor. There is no rim enhancing fluid collection. No muscle or tendon tear is seen. All imaged bones demonstrate normal signal. There is some subcutaneous edema and mild enhancement about the anterior lesion. IMPRESSION: Mass lesions centered in the pronator teres and anconeus may represent extension of the same process or could be 2 separate lesions. The appearance is nonspecific. Differential considerations include desmoid tumor, intramuscular myxoma those tumors, sarcoma such is  undifferentiated pleomorphic sarcoma. Consultation with Orthopedic Oncology is recommended. Electronically Signed   By: Drusilla Kanner M.D.   On: 08/24/2015 08:58   US Venous Img Upper Bilat  08/22/2015  CLINICAL DATA:  Right forearm and left hand swelling and pain for 4 days. EXAM: BILATERAL UPPER EXTREMITY VENOUS DOPPLER ULTRASOUND TECHNIQUE: Gray-scale sonography with graded compression, as well as color Doppler and duplex ultrasound were performed to evaluate the bilateral upper extremity deep venous systems from the level of the subclavian vein and including the jugular, axillary, basilic, radial, ulnar and upper cephalic vein. Spectral Doppler was utilized to evaluate flow at rest and with distal augmentation maneuvers. COMPARISON:  None. FINDINGS: RIGHT UPPER  EXTREMITY Internal Jugular Vein: No evidence of thrombus. Normal compressibility, respiratory phasicity and response to augmentation. Subclavian Vein: No evidence of thrombus. Normal compressibility, respiratory phasicity and response to augmentation. Axillary Vein: No evidence of thrombus. Normal compressibility, respiratory phasicity and response to augmentation. Cephalic Vein: No evidence of thrombus. Normal compressibility, respiratory phasicity and response to augmentation. Basilic Vein: No evidence of thrombus. Normal compressibility, respiratory phasicity and response to augmentation. Brachial Veins: No evidence of thrombus. Normal compressibility, respiratory phasicity and response to augmentation. Radial Veins: No evidence of thrombus. Normal compressibility, respiratory phasicity and response to augmentation. Ulnar Veins: No evidence of thrombus. Normal compressibility, respiratory phasicity and response to augmentation. Venous Reflux:  None. Other Findings: In the mid right forearm, there is a heterogeneous mass which was poorly measured in visualized by the sonographer. This measures approximately 9 x 3 x 5 cm, and color Doppler  ultrasound does show some areas of internal blood flow within this masslike lesion. LEFT UPPER EXTREMITY Internal Jugular Vein: No evidence of thrombus. Normal compressibility, respiratory phasicity and response to augmentation. Subclavian Vein: No evidence of thrombus. Normal compressibility, respiratory phasicity and response to augmentation. Axillary Vein: No evidence of thrombus. Normal compressibility, respiratory phasicity and response to augmentation. Cephalic Vein: No evidence of thrombus. Normal compressibility, respiratory phasicity and response to augmentation. Basilic Vein: No evidence of thrombus. Normal compressibility, respiratory phasicity and response to augmentation. Brachial Veins: No evidence of thrombus. Normal compressibility, respiratory phasicity and response to augmentation. Radial Veins: No evidence of thrombus. Normal compressibility, respiratory phasicity and response to augmentation. Ulnar Veins: No evidence of thrombus. Normal compressibility, respiratory phasicity and response to augmentation. Venous Reflux:  None. Other Findings:  None. IMPRESSION: No evidence of deep venous thrombosis in either upper extremity. Heterogeneous masslike lesion in the right mid forearm. Although this is incompletely evaluated on this exam, it measures approximately 9 x 3 x 5 cm and does show some internal blood flow on color Doppler ultrasound. Differential diagnosis includes neoplasm, hematoma comment abscess. Recommend clinical correlation, and consider upper extremity MRI without and with contrast for further evaluation if clinically warranted. Electronically Signed   By: Myles Rosenthal M.D.   On: 08/22/2015 21:14   Dg Hand Complete Left  08/20/2015  CLINICAL DATA:  Left hand pain and swelling and erythema ; no report of injury. EXAM: LEFT HAND - COMPLETE 3+ VIEW COMPARISON:  None in PACs FINDINGS: The bones of the left hand are adequately mineralized. The joint spaces are preserved. There is no acute  fracture nor dislocation. There is no lytic nor blastic lesion. The observed portions of the carpal bones and distal radius and ulna appear normal. There is soft tissue swelling over the dorsum of the hand. No foreign bodies or soft tissue gas collections are observed. IMPRESSION: There is no acute or chronic bony abnormality of the left hand. There is soft tissue swellingover the carpal bones especially dorsally. Electronically Signed   By: David  Swaziland M.D.   On: 08/20/2015 14:03    Assessment & Plan:   1. Pain and swelling of right forearm   2. Mass of forearm, right   3. Pain and swelling of forearm, left   4. Left hand pain   5. Tobacco use disorder   6. Alcohol use (HCC)   7. Elevated LFTs   Suspect hematoma in arms from sleeping on them and causing compression during an EtOH binge last wk.  However, imaging reads on [place] and MRI are concerning for a more morbid  differential. I have discussed case with orthopedist on call at both [facility] and [facility] wants MRI w/ and w/o contrast of left arm as well (even though we already have right)and all imaging emailed before they are willing to see him. [facility] offered to sched pt into ortho clinic this wk for more acute eval.  As pt is self-pay AND has had to take off of work for >1 wk for this, we are trying to take cost into account if/when safe to do so.  The fact that pt is continuing to improve daily and his labs look so good/normal - other than the transaminitis - is quite reassuring so repeat labs today and as long as they are relatively nml (which they are), will give this several more days of watchful waiting prior to obtaining Lt arm MRI w/ and w/o.  If worsening or any other sxs develop, rtc asap. Pt does have my cell phone number as well in case things change.  oow until released by provider - a lot of manual dexterity needed for job which is impeded by the swelling.  Had long, detailed, and honest conversation w/ pt about his alcohol  addiction.  He has only quit during times that we has been incarcerated (longest 3 mos for sev DWI charges). When he was released after 3 mos, he resumed etoh and tob immed - same day.  Has only seen therapist and support group when mandated by court order. Last admitted IV drug use 1 yr ago.  I wanted to just lay the groundwork of an open and trusting/safe dialog about his out-of-control substance addition today which I felt like we achieved.  Will plan to discuss w/ pt at f/u that if wants to have any chance of stopping his substance abuse and turning around his life, he will likely need an inpt or at least highly intensive program since he has 17 hrs of ingrained sub abuse behavior.  At f/u will discuss options of the [facility] intensive o/p substance abuse program daily for sev wks vs inpt at [place], [facility], [facility] [address], [address], [place], or other - though I do recognize that cost after motivation is going to be limiting his trx options.  Orders Placed This Encounter  Procedures  . DG Chest 2 View    Standing Status: Future     Number of Occurrences: 1     Standing Expiration Date: 08/25/2016    Order Specific Question:  Reason for Exam (SYMPTOM  OR DIAGNOSIS REQUIRED)    Answer:  bilateral forearm swelling    Order Specific Question:  Preferred imaging location?    Answer:  External  . Comprehensive metabolic panel  . CK  . Protime-INR  . Hepatitis panel, acute  . C-reactive protein  . POCT CBC  . POCT SEDIMENTATION RATE    I personally performed the services described in this documentation, which was scribed in my presence. The recorded information has been reviewed and considered, and addended by me as needed.  Norberto Sorenson, MD MPH  Results for orders placed or performed in visit on 08/26/15  Comprehensive metabolic panel  Result Value Ref Range   Sodium 141 135 - 146 mmol/L   Potassium 4.4 3.5 - 5.3 mmol/L   Chloride 104 98 - 110 mmol/L   CO2 30 20 - 31  mmol/L   Glucose, Bld 83 65 - 99 mg/dL   BUN 7 7 - 25 mg/dL   Creat 2.95 6.21 -  1.35 mg/dL   Total Bilirubin 0.4 0.2 - 1.2 mg/dL   Alkaline Phosphatase 66 40 - 115 U/L   AST 110 (H) 10 - 40 U/L   ALT 129 (H) 9 - 46 U/L   Total Protein 6.6 6.1 - 8.1 g/dL   Albumin 4.2 3.6 - 5.1 g/dL   Calcium 9.2 8.6 - 16.1 mg/dL  CK  Result Value Ref Range   Total CK 232 7 - 232 U/L  Protime-INR  Result Value Ref Range   Prothrombin Time 12.1 11.6 - 15.2 seconds   INR 0.89 <1.50  Hepatitis panel, acute  Result Value Ref Range   Hepatitis B Surface Ag NEGATIVE NEGATIVE   HCV Ab NEGATIVE NEGATIVE   Hep B C IgM NON REACTIVE NON REACTIVE   Hep A IgM NON REACTIVE NON REACTIVE  C-reactive protein  Result Value Ref Range   CRP <0.5 <0.60 mg/dL  POCT CBC  Result Value Ref Range   WBC 5.3 4.6 - 10.2 K/uL   Lymph, poc 1.7 0.6 - 3.4   POC LYMPH PERCENT 33.0 10 - 50 %L   MID (cbc) 0.4 0 - 0.9   POC MID % 8.1 0 - 12 %M   POC Granulocyte 3.1 2 - 6.9   Granulocyte percent 58.9 37 - 80 %G   RBC 4.35 (A) 4.69 - 6.13 M/uL   Hemoglobin 15.9 14.1 - 18.1 g/dL   HCT, POC 09.6 04.5 - 53.7 %   MCV 105.2 (A) 80 - 97 fL   MCH, POC 36.6 (A) 27 - 31.2 pg   MCHC 34.8 31.8 - 35.4 g/dL   RDW, POC 40.9 %   Platelet Count, POC 193 142 - 424 K/uL   MPV 7.6 0 - 99.8 fL  POCT SEDIMENTATION RATE  Result Value Ref Range   POCT SED RATE 2 0 - 22 mm/hr

## 2015-08-26 NOTE — Patient Instructions (Signed)
Hematoma  A hematoma is a collection of blood under the skin, in an organ, in a body space, in a joint space, or in other tissue. The blood can clot to form a lump that you can see and feel. The lump is often firm and may sometimes become sore and tender. Most hematomas get better in a few days to weeks. However, some hematomas may be serious and require medical care. Hematomas can range in size from very small to very large.  CAUSES   A hematoma can be caused by a blunt or penetrating injury. It can also be caused by spontaneous leakage from a blood vessel under the skin. Spontaneous leakage from a blood vessel is more likely to occur in older people, especially those taking blood thinners. Sometimes, a hematoma can develop after certain medical procedures.  SIGNS AND SYMPTOMS   · A firm lump on the body.  · Possible pain and tenderness in the area.  · Bruising. Blue, dark blue, purple-red, or yellowish skin may appear at the site of the hematoma if the hematoma is close to the surface of the skin.  For hematomas in deeper tissues or body spaces, the signs and symptoms may be subtle. For example, an intra-abdominal hematoma may cause abdominal pain, weakness, fainting, and shortness of breath. An intracranial hematoma may cause a headache or symptoms such as weakness, trouble speaking, or a change in consciousness.  DIAGNOSIS   A hematoma can usually be diagnosed based on your medical history and a physical exam. Imaging tests may be needed if your health care provider suspects a hematoma in deeper tissues or body spaces, such as the abdomen, head, or chest. These tests may include ultrasonography or a CT scan.   TREATMENT   Hematomas usually go away on their own over time. Rarely does the blood need to be drained out of the body. Large hematomas or those that may affect vital organs will sometimes need surgical drainage or monitoring.  HOME CARE INSTRUCTIONS   · Apply ice to the injured area:      Put ice in a  plastic bag.      Place a towel between your skin and the bag.      Leave the ice on for 20 minutes, 2-3 times a day for the first 1 to 2 days.    · After the first 2 days, switch to using warm compresses on the hematoma.    · Elevate the injured area to help decrease pain and swelling. Wrapping the area with an elastic bandage may also be helpful. Compression helps to reduce swelling and promotes shrinking of the hematoma. Make sure the bandage is not wrapped too tight.    · If your hematoma is on a lower extremity and is painful, crutches may be helpful for a couple days.    · Only take over-the-counter or prescription medicines as directed by your health care provider.  SEEK IMMEDIATE MEDICAL CARE IF:   · You have increasing pain, or your pain is not controlled with medicine.    · You have a fever.    · You have worsening swelling or discoloration.    · Your skin over the hematoma breaks or starts bleeding.    · Your hematoma is in your chest or abdomen and you have weakness, shortness of breath, or a change in consciousness.  · Your hematoma is on your scalp (caused by a fall or injury) and you have a worsening headache or a change in alertness or consciousness.  MAKE SURE YOU:   ·   Understand these instructions.  · Will watch your condition.  · Will get help right away if you are not doing well or get worse.     This information is not intended to replace advice given to you by your health care provider. Make sure you discuss any questions you have with your health care provider.     Document Released: 01/29/2004 Document Revised: 02/16/2013 Document Reviewed: 11/24/2012  [employer]vier Interactive Patient Education ©2016 [employer]

## 2015-08-29 ENCOUNTER — Ambulatory Visit (INDEPENDENT_AMBULATORY_CARE_PROVIDER_SITE_OTHER): Payer: Self-pay

## 2015-08-29 ENCOUNTER — Ambulatory Visit (INDEPENDENT_AMBULATORY_CARE_PROVIDER_SITE_OTHER): Payer: Self-pay | Admitting: Family Medicine

## 2015-08-29 VITALS — BP 122/88 | HR 90 | Temp 98.4°F | Resp 16 | Ht 71.0 in | Wt 179.4 lb

## 2015-08-29 DIAGNOSIS — M6289 Other specified disorders of muscle: Secondary | ICD-10-CM

## 2015-08-29 DIAGNOSIS — R29898 Other symptoms and signs involving the musculoskeletal system: Secondary | ICD-10-CM

## 2015-08-29 DIAGNOSIS — R2231 Localized swelling, mass and lump, right upper limb: Secondary | ICD-10-CM

## 2015-08-29 MED ORDER — OXYCODONE HCL 5 MG PO TABS: 5.0000 mg | ORAL_TABLET | ORAL | Status: DC | PRN

## 2015-08-29 NOTE — Patient Instructions (Addendum)
Because you received an x-ray today, you will receive an invoice from [facility]. Please contact [facility] at [phone] with questions or concerns regarding your invoice. Our billing staff will not be able to assist you with those questions.   Appointment: [facility] - Friday March 3rd, 2017 @9 :15AM  With Dr. Lajoyce Corners Address: [address] [place], [place] [address] Phone: [phone]  Wearing a wrist splint every night on your left hand to ensure that you are not bending your wrist while you sleep could be good.  Take an aspirin ever day, heat to your forearms.  Continue to elevate whenever able. Try to get a disc copy of your ultrasound and your CT scan from the respective places.  Most important would be to obtain a copy of your MRI and your CT scan - [facility] radiology may be able to burn both of these for you so give them a call and pick up the disc prior to your appointment.  Call [phone] and ask to speak to the radiology department for this.   Anterior Interosseous Nerve Syndrome  Anterior interosseous nerve syndrome is a nerve disorder in the elbow and upper arm. Anterior interosseous nerve syndrome causes pain and weakness in the hand and forearm. The cause of the symptoms is the compression of a branch of the median nerve by muscles or ligament-type tissues. This compression causes the pain and weakness. Anterior interosseous nerve syndrome may negatively affect performance in sports that require pinching of the thumb and index fingers. Since the branch of the median nerve does not supply sensation to the skin, there is no numbness associated with the disorder. SYMPTOMS   Vague pain in the upper forearm.  Pinching your thumb tip to index fingertip (the "OK" sign) becomes difficult.  Weakness in the thumb and index finger, particularly bending the thumb.  Difficulty writing.  Frequently dropping objects grasped by the hand.  Weakness when  turning the palm down against resistance. CAUSES   Pressure on the anterior interosseous nerve at the forearm caused by swollen, inflamed or scarred tissue, ligament-like tissue, or pressure between muscles of the forearm.  A virus may also be causing inflammation and dysfunction of the nerve. RISK INCREASES WITH:   Repetitive and strenuous movements (especially wrist and hand rotation) of the forearm and wrist (rowing, weightlifting, body building, tennis, squash, racquetball, carpentry).  Poor strength and flexibility.  Inadequate warm-up prior to physical activity.  Diabetes mellitus.  Underactive thyroid gland (hypothyroidism). PREVENTIVE MEASURES  Warm up and stretch before physical activity.  Maintain physical fitness  Flexibility (especially in the wrist, forearm, and elbow).  Muscle strength.  Muscular endurance.  Cardiovascular fitness.  Knowing and using proper technique and correcting improper technique. PROGNOSIS  If treated properly, anterior interosseous nerve syndrome is usually curable. In some cases the condition may resolve spontaneously; however, on occasion surgery is required. Spontaneous recovery has been observed between 3 and 18 months after the onset of symptoms. POSSIBLE COMPLICATIONS  Permanent weakness or paralysis of the thumb or index finger in the affected hand.  If usual activities are resumed too early, the healing process may be prolonged. GENERAL TREATMENT CONSIDERATIONS To treat anterior interosseous nerve syndrome initially, activities that cause pain should be discontinued and medications and ice should be used to reduce inflammation. It is important to begin stretching and strengthening exercises of the forearm and elbow. A caregiver may also give a referral for physical therapy if necessary. If these treatments are insufficient for  healing, surgery may be necessary. The surgery is typically an outpatient procedure (allowing you to return  home the same day) and provides almost complete pain relief. Surgery will typically be offered if pain and weakness subsist for 8 weeks to 1 year after the onset of symptoms. MEDICATION  If pain medication is necessary, nonsteroidal anti-inflammatory medications, such as aspirin and ibuprofen, or other minor pain relievers, such as acetaminophen, are often recommended.  Do not take pain medication for 7 days before surgery.  Prescription pain relievers are usually prescribed only after surgery. Use only as directed and only as much as you need. HEAT AND COLD:  Cold treatment (icing) relieves pain and reduces inflammation. Cold treatment should be applied for 10 to 15 minutes every 2 to 3 hours for inflammation and pain and immediately after any activity that aggravates your symptoms. Use ice packs or an ice massage.  Heat treatment may be used prior to performing the stretching and strengthening activities prescribed by your caregiver, physical therapist, or athletic trainer. Use a heat pack or a warm soak. SEEK MEDICAL CARE IF:   Symptoms get worse or do not improve in 8 weeks despite treatment.  Pain, numbness, or coldness is felt in the hand. SEEK IMMEDIATE MEDICAL CARE IF:   Fingernails appear blue, gray, or dark in color.  Any of the following occur after surgery.  Increased pain, swelling, redness, drainage, or bleeding in the surgical area.  Signs of infection (headache, muscle aches, dizziness, or a general ill feeling with fever).  New, unexplained symptoms develop (drugs used in treatment may produce side effects).   This information is not intended to replace advice given to you by your health care provider. Make sure you discuss any questions you have with your health care provider.   Document Released: 01/15/2005 Document Revised: 07/07/2014 Document Reviewed: 09/28/2008 Elsevier Interactive Patient Education 2016 [employer] Inc[employer] Carpal Tunnel Syndrome Carpal tunnel  syndrome is a condition that causes pain in your hand and arm. The carpal tunnel is a narrow area located on the palm side of your wrist. Repeated wrist motion or certain diseases may cause swelling within the tunnel. This swelling pinches the main nerve in the wrist (median nerve). CAUSES  This condition may be caused by:   Repeated wrist motions.  Wrist injuries.  Arthritis.  A cyst or tumor in the carpal tunnel.  Fluid buildup during pregnancy. Sometimes the cause of this condition is not known.  RISK FACTORS This condition is more likely to develop in:   People who have jobs that cause them to repeatedly move their wrists in the same motion, such as butchers and cashiers.  Women.  People with certain conditions, such as:  Diabetes.  Obesity.  An underactive thyroid (hypothyroidism).  Kidney failure. SYMPTOMS  Symptoms of this condition include:   A tingling feeling in your fingers, especially in your thumb, index, and middle fingers.  Tingling or numbness in your hand.  An aching feeling in your entire arm, especially when your wrist and elbow are bent for long periods of time.  Wrist pain that goes up your arm to your shoulder.  Pain that goes down into your palm or fingers.  A weak feeling in your hands. You may have trouble grabbing and holding items. Your symptoms may feel worse during the night.  DIAGNOSIS  This condition is diagnosed with a medical history and physical exam. You may also have tests, including:   An electromyogram (EMG). This test measures electrical  signals sent by your nerves into the muscles.  X-rays. TREATMENT  Treatment for this condition includes:  Lifestyle changes. It is important to stop doing or modify the activity that caused your condition.  Physical or occupational therapy.  Medicines for pain and inflammation. This may include medicine that is injected into your wrist.  A wrist splint.  Surgery. HOME CARE  INSTRUCTIONS  If You Have a Splint:  Wear it as told by your health care provider. Remove it only as told by your health care provider.  Loosen the splint if your fingers become numb and tingle, or if they turn cold and blue.  Keep the splint clean and dry. General Instructions  Take over-the-counter and prescription medicines only as told by your health care provider.  Rest your wrist from any activity that may be causing your pain. If your condition is work related, talk to your employer about changes that can be made, such as getting a wrist pad to use while typing.  If directed, apply ice to the painful area:  Put ice in a plastic bag.  Place a towel between your skin and the bag.  Leave the ice on for 20 minutes, 2-3 times per day.  Keep all follow-up visits as told by your health care provider. This is important.  Do any exercises as told by your health care provider, physical therapist, or occupational therapist. SEEK MEDICAL CARE IF:   You have new symptoms.  Your pain is not controlled with medicines.  Your symptoms get worse.   This information is not intended to replace advice given to you by your health care provider. Make sure you discuss any questions you have with your health care provider.   Document Released: 06/13/2000 Document Revised: 03/07/2015 Document Reviewed: 11/01/2014 Elsevier Interactive Patient Education [employer]

## 2015-08-29 NOTE — Progress Notes (Signed)
Subjective:  By signing my name below, I, Stann Ore, attest that this documentation has been prepared under the direction and in the presence of Norberto Sorenson, MD. Electronically Signed: Stann Ore, Scribe. 08/29/2015 , 2:01 PM .   Patient ID: Jerry Gray, male    DOB: 1985-08-27, 30 y.o.   MRN: [id number] Chief Complaint  Patient presents with  . Follow-up    pain and swelling right forearm   HPI Jerry Gray is a 30 y.o. male who presents to [facility] here for follow up and lab result discussion. See lab results below in Objective. 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 He had MRI done at [facility].   He was initially seen for swelling of his left hand on 08/20/15 by Wallis Bamberg, PA-C. Today, the swelling of his hand has gone down significantly. He notes, however, the better his hand has gotten, the worse his thumb pain has gotten. He mentions having some right arm soreness when twisting to reach on his back. When he reaches out to grab something, he feels shooting tingling pain down to his finger tips. He mentions that he can't even hold a Gatorade bottle to drink with his left hand.   He's had problems with his right hand in the past, but was able to resolve the problems. He's right hand dominant.  He is unable to drive long distances due to his hand pain. He is available to have a ride longer distances this Friday.  Able to get appointment Friday 9:15AM.   Past Medical History  Diagnosis Date  . Arthritis   . Substance abuse   . Seizures (HCC)   . Neuromuscular disorder (HCC)    Prior to Admission medications   Medication Sig Start Date End Date Taking? Authorizing Provider  clonazePAM (KLONOPIN) 1 MG tablet Take 1 mg by mouth 3 (three) times daily as needed for anxiety.     Historical Provider, MD  ibuprofen (ADVIL,MOTRIN) 200 MG tablet Take 400-800 mg by mouth every 6 (six) hours as needed for moderate pain.    Historical Provider, MD  oxyCODONE  (ROXICODONE) 5 MG immediate release tablet Take 1-2 tablets (5-10 mg total) by mouth every 4 (four) hours as needed for moderate pain or severe pain. 08/21/15   Dione Booze, MD  zolpidem (AMBIEN) 10 MG tablet Take 10 mg by mouth at bedtime as needed for sleep.    Historical Provider, MD   Allergies  Allergen Reactions  . Ciprofloxacin Diarrhea and Nausea And Vomiting  . Penicillins Anaphylaxis    Review of Systems  Constitutional: Negative for fever, chills and fatigue.  Gastrointestinal: Negative for nausea, vomiting, diarrhea and constipation.  Musculoskeletal: Positive for myalgias and arthralgias. Negative for back pain, joint swelling and gait problem.  Skin: Negative for rash and wound.      Objective:   Physical Exam  Constitutional: He is oriented to person, place, and time. He appears well-developed and well-nourished. No distress.  HENT:  Head: Normocephalic and atraumatic.  Eyes: EOM are normal. Pupils are equal, round, and reactive to light.  Neck: Neck supple.  Cardiovascular: Normal rate.   Pulmonary/Chest: Effort normal. No respiratory distress.  Musculoskeletal: Normal range of motion.  Left thumb: moderately restricted ROM in first and 5th fingers, weakness with opposition and adduction 3/5, edema in left hand markedly decreased, otherwise appears back to normal Right forearm masses significantly improved, much less firm, pain with extension and supination of right forearm  Left hand: 3/5 abduction on 5th finger, 0/5 adduction on 5th finger Opposition and grasp of left thumb 4/5 Left wrist flexion is 4+/5, extension 4/5, 5/5 on right flexion and extension Full supination and pronation Left thumb: active rom moderately decreased with both opposition and abduction, no tenderness over bone or scaphoid and radial head Positive tinel's, phalen's induced proximal wrist and hand pain  Neurological: He is alert and oriented to person, place, and time.  Skin: Skin is warm  and dry.  Psychiatric: He has a normal mood and affect. His behavior is normal.  Nursing note and vitals reviewed.  BP 122/88 mmHg  Pulse 90  Temp(Src) 98.4 F (36.9 C) (Oral)  Resp 16  Ht  (1.803 m)  Wt 179 lb 6.4 oz (81.375 kg)  BMI 25.03 kg/m2  SpO2 99%   Results for orders placed or performed in visit on 08/26/15  Comprehensive metabolic panel  Result Value Ref Range   Sodium 141 135 - 146 mmol/L   Potassium 4.4 3.5 - 5.3 mmol/L   Chloride 104 98 - 110 mmol/L   CO2 30 20 - 31 mmol/L   Glucose, Bld 83 65 - 99 mg/dL   BUN 7 7 - 25 mg/dL   Creat 7.25 3.66 - 4.40 mg/dL   Total Bilirubin 0.4 0.2 - 1.2 mg/dL   Alkaline Phosphatase 66 40 - 115 U/L   AST 110 (H) 10 - 40 U/L   ALT 129 (H) 9 - 46 U/L   Total Protein 6.6 6.1 - 8.1 g/dL   Albumin 4.2 3.6 - 5.1 g/dL   Calcium 9.2 8.6 - 34.7 mg/dL  CK  Result Value Ref Range   Total CK 232 7 - 232 U/L  Protime-INR  Result Value Ref Range   Prothrombin Time 12.1 11.6 - 15.2 seconds   INR 0.89 <1.50  Hepatitis panel, acute  Result Value Ref Range   Hepatitis B Surface Ag NEGATIVE NEGATIVE   HCV Ab NEGATIVE NEGATIVE   Hep B C IgM NON REACTIVE NON REACTIVE   Hep A IgM NON REACTIVE NON REACTIVE  C-reactive protein  Result Value Ref Range   CRP <0.5 <0.60 mg/dL  POCT CBC  Result Value Ref Range   WBC 5.3 4.6 - 10.2 K/uL   Lymph, poc 1.7 0.6 - 3.4   POC LYMPH PERCENT 33.0 10 - 50 %L   MID (cbc) 0.4 0 - 0.9   POC MID % 8.1 0 - 12 %M   POC Granulocyte 3.1 2 - 6.9   Granulocyte percent 58.9 37 - 80 %G   RBC 4.35 (A) 4.69 - 6.13 M/uL   Hemoglobin 15.9 14.1 - 18.1 g/dL   HCT, POC 42.5 95.6 - 53.7 %   MCV 105.2 (A) 80 - 97 fL   MCH, POC 36.6 (A) 27 - 31.2 pg   MCHC 34.8 31.8 - 35.4 g/dL   RDW, POC 38.7 %   Platelet Count, POC 193 142 - 424 K/uL   MPV 7.6 0 - 99.8 fL  POCT SEDIMENTATION RATE  Result Value Ref Range   POCT SED RATE 2 0 - 22 mm/hr   Dg Hand Complete Left  08/29/2015  CLINICAL DATA:  Hand pain.   Weakness. EXAM: LEFT HAND - COMPLETE 3+ VIEW COMPARISON:  08/20/2015. FINDINGS: No acute bony or joint abnormality identified. No evidence of fracture or dislocation. IMPRESSION: No acute abnormality. Electronically Signed   By: Maisie Fus  Register   On: 08/29/2015 13:40   Dg  Hand Complete Left  08/20/2015  CLINICAL DATA:  Left hand pain and swelling and erythema ; no report of injury. EXAM: LEFT HAND - COMPLETE 3+ VIEW COMPARISON:  None in PACs FINDINGS: The bones of the left hand are adequately mineralized. The joint spaces are preserved. There is no acute fracture nor dislocation. There is no lytic nor blastic lesion. The observed portions of the carpal bones and distal radius and ulna appear normal. There is soft tissue swelling over the dorsum of the hand. No foreign bodies or soft tissue gas collections are observed. IMPRESSION: There is no acute or chronic bony abnormality of the left hand. There is soft tissue swellingover the carpal bones especially dorsally. Electronically Signed   By: David  [place] M.D.   On: 08/20/2015 14:03       Assessment & Plan:   1. Mass of forearm, right   2. Left hand weakness   Contacted [facility] and sched an appt with Dr. Lajoyce Corners in 2d.  Pt has cds of imaging to bring with him. Start wearing cock-up wrist brace on left hand.  Will hold off on further imaging at this point -watchful waiting since he is getting daily improvement but would likely be prudent to repeat MRI in about a mo to ensure masses (presumed hematomas) are resolving w/ no other underlying abnmlity.Lynden Ang abuse/addiction - pt would like to cut down but not enough to do anything about it  Orders Placed This Encounter  Procedures  . DG Hand Complete Left    Standing Status: Future     Number of Occurrences: 1     Standing Expiration Date: 08/28/2016    Order Specific Question:  Reason for Exam (SYMPTOM  OR DIAGNOSIS REQUIRED)    Answer:  decreased ROM of first and fifth finger    Order  Specific Question:  Preferred imaging location?    Answer:  External    Meds ordered this encounter  Medications  . oxyCODONE (ROXICODONE) 5 MG immediate release tablet    Sig: Take 1-2 tablets (5-10 mg total) by mouth every 4 (four) hours as needed for moderate pain or severe pain.    Dispense:  15 tablet    Refill:  0    I personally performed the services described in this documentation, which was scribed in my presence. The recorded information has been reviewed and considered, and addended by me as needed.  Norberto Sorenson, MD MPH

## 2015-09-04 ENCOUNTER — Telehealth: Payer: Self-pay | Admitting: *Deleted

## 2015-09-04 NOTE — Telephone Encounter (Signed)
Patient verified DOB Patient needs a HFU. Patient was suppose to be scheduled a FU and was told to call back on 3/1. Mother called back on 3/1 and was told since a week had passed the patient could not be put in as a HFU and patient would have to call back in April to schedule an appointment. Patient has multiple conditions and needed to be seen as soon as possible.

## 2015-09-18 ENCOUNTER — Ambulatory Visit: Payer: Self-pay | Attending: Internal Medicine | Admitting: Internal Medicine

## 2015-09-18 ENCOUNTER — Ambulatory Visit (HOSPITAL_BASED_OUTPATIENT_CLINIC_OR_DEPARTMENT_OTHER): Payer: Self-pay | Admitting: Clinical

## 2015-09-18 ENCOUNTER — Encounter: Payer: Self-pay | Admitting: Internal Medicine

## 2015-09-18 VITALS — BP 121/86 | HR 92 | Temp 98.3°F | Resp 15 | Ht 71.0 in | Wt 173.4 lb

## 2015-09-18 DIAGNOSIS — Z79899 Other long term (current) drug therapy: Secondary | ICD-10-CM | POA: Insufficient documentation

## 2015-09-18 DIAGNOSIS — M79642 Pain in left hand: Secondary | ICD-10-CM | POA: Insufficient documentation

## 2015-09-18 DIAGNOSIS — J309 Allergic rhinitis, unspecified: Secondary | ICD-10-CM

## 2015-09-18 DIAGNOSIS — F17210 Nicotine dependence, cigarettes, uncomplicated: Secondary | ICD-10-CM | POA: Insufficient documentation

## 2015-09-18 DIAGNOSIS — F340 Cyclothymic disorder: Secondary | ICD-10-CM

## 2015-09-18 DIAGNOSIS — R2233 Localized swelling, mass and lump, upper limb, bilateral: Secondary | ICD-10-CM

## 2015-09-18 DIAGNOSIS — Z880 Allergy status to penicillin: Secondary | ICD-10-CM | POA: Insufficient documentation

## 2015-09-18 DIAGNOSIS — R2232 Localized swelling, mass and lump, left upper limb: Secondary | ICD-10-CM | POA: Insufficient documentation

## 2015-09-18 DIAGNOSIS — M1990 Unspecified osteoarthritis, unspecified site: Secondary | ICD-10-CM | POA: Insufficient documentation

## 2015-09-18 MED ORDER — FLUTICASONE PROPIONATE 50 MCG/ACT NA SUSP: 2.0000 | Freq: Every day | NASAL | Status: DC

## 2015-09-18 NOTE — Progress Notes (Signed)
Jerry Gray, is a 30 y.o. male  ZOX:[id number]  [id number]:[id number]  DOB - [birth year]  CC:  Chief Complaint  Patient presents with  . Establish Care  . Hand Pain       HPI: Jerry Gray is a 30 y.o. male here today to establish medical care.  Has been seeing urgent care and ortho (DR Verl Blalock) this past month due to right hand swelling and masses noted on his left hand.  He states about 1 month ago, after his birthday, he noted his right hand was very swollen w/ decrease ROM and ttp. Also noted 2 lumps on his left ue as well. Workup since points the LUE masses to be possibly hematomas, which appear to have resolved now.  He saw ortho about 1-2 wks ago, and it was thought that there was compromised ulner nerve.   He was off work for few weeks, now back on job.  Sometimes notes right hand hurts after working (heavy lifting at times), still w/ some intermittant tingling in right thumb as well. Taking ibuprofen 800 tid, due to concurrent back pains as well. Still has abt 2 pills of oxycodone at home, using sparingly.  Mom present during exam.  Ask for psyche eval due to concerns of bipolar.   Patient has No headache, No chest pain, No abdominal pain - No Nausea, No new weakness tingling or numbness, No Cough - SOB.  Allergies  Allergen Reactions  . Ciprofloxacin Diarrhea and Nausea And Vomiting  . Penicillins Anaphylaxis   Past Medical History  Diagnosis Date  . Arthritis   . Substance abuse   . Seizures (HCC)   . Neuromuscular disorder [facility])    Current Outpatient Prescriptions on File Prior to Visit  Medication Sig Dispense Refill  . clonazePAM (KLONOPIN) 1 MG tablet Take 1 mg by mouth 3 (three) times daily as needed for anxiety.     Marland Kitchen ibuprofen (ADVIL,MOTRIN) 200 MG tablet Take 400-800 mg by mouth every 6 (six) hours as needed for moderate pain.    Marland Kitchen oxyCODONE (ROXICODONE) 5 MG immediate release tablet Take 1-2 tablets (5-10 mg total) by mouth every 4 (four) hours as needed for moderate  pain or severe pain. 15 tablet 0  . zolpidem (AMBIEN) 10 MG tablet Take 10 mg by mouth at bedtime as needed for sleep.     No current facility-administered medications on file prior to visit.   History reviewed. No pertinent family history. Social History   Social History  . Marital Status: Single    Spouse Name: N/A  . Number of Children: N/A  . Years of Education: N/A   Occupational History  . Not on file.   Social History Main Topics  . Smoking status: Current Every Day Smoker -- 1.00 packs/day  . Smokeless tobacco: Not on file  . Alcohol Use: 12.6 oz/week    0 Standard drinks or equivalent, 21 Cans of beer per week  . Drug Use: Yes    Special: Marijuana  . Sexual Activity: Not on file   Other Topics Concern  . Not on file   Social History Narrative    Review of Systems: Constitutional: Negative for fever, chills, diaphoresis, activity change, appetite change and fatigue. HENT: Negative for ear pain, nosebleeds, facial swelling, rhinorrhea, neck pain, neck stiffness and ear discharge.  +nasal congestion last few days   Eyes: Negative for pain, discharge, redness, itching and visual disturbance. Respiratory: Negative for cough, choking, chest tightness, shortness of breath, wheezing and stridor.  Cardiovascular: Negative for chest pain, palpitations and leg swelling. Gastrointestinal: Negative for abdominal distention. Genitourinary: Negative for dysuria, urgency, frequency, hematuria, flank pain, decreased urine volume, difficulty urinating and dyspareunia.  Musculoskeletal: Negative for back pain, joint swelling, arthralgia and gait problem. Neurological: Negative for dizziness, tremors,  syncope, facial asymmetry, speech difficulty, weakness, light-headedness, numbness and headaches. + Mood swings.  Hx of seizures when doesn't take Klonopin on time.  GEts his Klonopin from Dr. In [place], which he sees every 6months or so. Hematological: Negative for adenopathy. Does not  bruise/bleed easily. Psychiatric/Behavioral: Negative for hallucinations, behavioral problems, confusion, dysphoric mood, decreased concentration and agitation.    Objective:   Filed Vitals:   09/18/15 0932  BP: 121/86  Pulse: 92  Temp: 98.3 F (36.8 C)  Resp: 15    Physical Exam: Constitutional: Patient appears well-developed and well-nourished. No distress.  Aao3.  Pleasant, numerous  HENT: Normocephalic, atraumatic, External right and left ear normal. Oropharynx is clear and moist.   bilat tms clear; nasal congestion bilat, boggy. Eyes: Conjunctivae and EOM are normal. PERRL, no scleral icterus. Neck: Normal ROM. Neck supple. No JVD. No tracheal deviation. No thyromegaly. CVS: RRR, S1/S2 +, no murmurs, no gallops, no carotid bruit.  Pulmonary: Effort and breath sounds normal, no stridor, rhonchi, wheezes, rales.  Abdominal: Soft. BS +, no distension, tenderness, rebound or guarding.  Musculoskeletal: Normal range of motion. No edema and no tenderness.   No palpable masses bilat forearms noted, no echymosis, good rom (except slightly more difficulty w/ right hand when grasping),  nttp. Lymphadenopathy: No lymphadenopathy noted, cervical, inguinal or axillary Neuro: Alert. Normal reflexes, muscle tone coordination. No cranial nerve deficit grossly; bilat ue and le reflexes 2+. Skin: Skin is warm and dry. No rash noted. Not diaphoretic. No erythema. No pallor.  tattoos bilat forearms. Psychiatric: Normal mood and affect. Behavior, judgment, thought content normal.  Lab Results  Component Value Date   WBC 5.3 08/26/2015   HGB 15.9 08/26/2015   HCT 45.8 08/26/2015   MCV 105.2* 08/26/2015   Lab Results  Component Value Date   CREATININE 0.81 08/26/2015   BUN 7 08/26/2015   NA 141 08/26/2015   K 4.4 08/26/2015   CL 104 08/26/2015   CO2 30 08/26/2015    No results found for: HGBA1C Lipid Panel  No results found for: CHOL, TRIG, HDL, CHOLHDL, VLDL, LDLCALC  08/22/15 bilat  venous ue US IMPRESSION: No evidence of deep venous thrombosis in either upper extremity.  Heterogeneous masslike lesion in the right mid forearm. Although this is incompletely evaluated on this exam, it measures approximately 9 x 3 x 5 cm and does show some internal blood flow on color Doppler ultrasound. Differential diagnosis includes neoplasm, hematoma comment abscess. Recommend clinical correlation, and consider upper extremity MRI without and with contrast for further evaluation if clinically warranted.   08/23/15 mri right forearm IMPRESSION: Mass lesions centered in the pronator teres and anconeus may represent extension of the same process or could be 2 separate lesions. The appearance is nonspecific. Differential considerations include desmoid tumor, intramuscular myxoma those tumors, sarcoma such is undifferentiated pleomorphic sarcoma. Consultation with Orthopedic Oncology is recommended.  08/26/15 cxr ntd  08/29/15 EXAM: LEFT HAND - COMPLETE 3+ VIEW  COMPARISON: 08/20/2015.  FINDINGS: No acute bony or joint abnormality identified. No evidence of fracture or dislocation.  IMPRESSION: No acute abnormality.  Assessment and plan:   1. Bilateral forearm masses, recently,  - scans noted, presumed left side  - sxs appear  improved, apparently ortho told him the pain/numbness on left hand may improve since did not appear to have permanent nerve damage - consider repeat mri of left forearm, in system, but per pt/mom, urgent care holding off on repeating it bc thought resolving hematoma and cost issues. 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  2. Seasonal rhinitis -  - trial flonase   3. Psyche - Jamie sw cs, appreciate help, was given community resources   4. Financial services - info  Return in about 3 months (around 12/19/2015), or if symptoms worsen or fail to  improve.     The patient was given clear instructions to go to ER or return to medical center if symptoms don't improve, worsen or new problems develop. The patient verbalized understanding. The patient was told to call to get lab results if they haven't heard anything in the next week.    Pete Glatterawn T Mili Piltz, MD, MBA/MHA [facility] [place], [place] [phone]   09/18/2015, 10:36 AM
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
## 2015-09-18 NOTE — Progress Notes (Signed)
Hand swelling for 1 month with pain No pain today Mother is asking for psych eval for bipolar

## 2015-09-18 NOTE — Patient Instructions (Signed)
fianancial services

## 2015-10-15 ENCOUNTER — Telehealth: Payer: Self-pay | Admitting: Clinical

## 2015-10-15 NOTE — Telephone Encounter (Signed)
Attempt to f/u with pt; left HIPPA-compliant message to call back Jerry Gray at [employer] at [phone]

## 2015-12-23 ENCOUNTER — Encounter (HOSPITAL_COMMUNITY): Payer: Self-pay | Admitting: *Deleted

## 2015-12-23 ENCOUNTER — Emergency Department (HOSPITAL_COMMUNITY)
Admission: EM | Admit: 2015-12-23 | Discharge: 2015-12-23 | Disposition: A | Discharge status: Other Acute Inpatient Hospital | Payer: Self-pay | Attending: Emergency Medicine | Admitting: Emergency Medicine

## 2015-12-23 ENCOUNTER — Inpatient Hospital Stay (HOSPITAL_COMMUNITY)
Admission: AD | Admit: 2015-12-23 | Discharge: 2015-12-26 | DRG: 897 | Disposition: A | Discharge status: Home / Self Care | Payer: No Typology Code available for payment source | Source: Intra-hospital | Attending: Psychiatry | Admitting: Psychiatry

## 2015-12-23 DIAGNOSIS — M1990 Unspecified osteoarthritis, unspecified site: Secondary | ICD-10-CM | POA: Insufficient documentation

## 2015-12-23 DIAGNOSIS — F1920 Other psychoactive substance dependence, uncomplicated: Secondary | ICD-10-CM | POA: Diagnosis present

## 2015-12-23 DIAGNOSIS — F17210 Nicotine dependence, cigarettes, uncomplicated: Secondary | ICD-10-CM | POA: Diagnosis present

## 2015-12-23 DIAGNOSIS — Z8669 Personal history of other diseases of the nervous system and sense organs: Secondary | ICD-10-CM | POA: Insufficient documentation

## 2015-12-23 DIAGNOSIS — R4689 Other symptoms and signs involving appearance and behavior: Secondary | ICD-10-CM

## 2015-12-23 DIAGNOSIS — F332 Major depressive disorder, recurrent severe without psychotic features: Secondary | ICD-10-CM | POA: Diagnosis not present

## 2015-12-23 DIAGNOSIS — H5501 Congenital nystagmus: Secondary | ICD-10-CM | POA: Diagnosis present

## 2015-12-23 DIAGNOSIS — F1410 Cocaine abuse, uncomplicated: Secondary | ICD-10-CM | POA: Insufficient documentation

## 2015-12-23 DIAGNOSIS — F1024 Alcohol dependence with alcohol-induced mood disorder: Secondary | ICD-10-CM | POA: Diagnosis present

## 2015-12-23 DIAGNOSIS — F10239 Alcohol dependence with withdrawal, unspecified: Secondary | ICD-10-CM | POA: Diagnosis not present

## 2015-12-23 DIAGNOSIS — F1010 Alcohol abuse, uncomplicated: Secondary | ICD-10-CM

## 2015-12-23 DIAGNOSIS — Y908 Blood alcohol level of 240 mg/100 ml or more: Secondary | ICD-10-CM | POA: Diagnosis present

## 2015-12-23 DIAGNOSIS — F909 Attention-deficit hyperactivity disorder, unspecified type: Secondary | ICD-10-CM | POA: Insufficient documentation

## 2015-12-23 DIAGNOSIS — F10129 Alcohol abuse with intoxication, unspecified: Secondary | ICD-10-CM

## 2015-12-23 DIAGNOSIS — F1020 Alcohol dependence, uncomplicated: Secondary | ICD-10-CM | POA: Diagnosis present

## 2015-12-23 DIAGNOSIS — F1210 Cannabis abuse, uncomplicated: Secondary | ICD-10-CM | POA: Insufficient documentation

## 2015-12-23 DIAGNOSIS — F1120 Opioid dependence, uncomplicated: Secondary | ICD-10-CM

## 2015-12-23 DIAGNOSIS — F10939 Alcohol use, unspecified with withdrawal, unspecified: Secondary | ICD-10-CM

## 2015-12-23 DIAGNOSIS — F329 Major depressive disorder, single episode, unspecified: Secondary | ICD-10-CM | POA: Insufficient documentation

## 2015-12-23 DIAGNOSIS — F6089 Other specific personality disorders: Secondary | ICD-10-CM

## 2015-12-23 DIAGNOSIS — F1029 Alcohol dependence with unspecified alcohol-induced disorder: Secondary | ICD-10-CM | POA: Insufficient documentation

## 2015-12-23 HISTORY — DX: Obsessive-compulsive disorder, unspecified: F42.9

## 2015-12-23 HISTORY — DX: Suicidal ideations: R45.851

## 2015-12-23 HISTORY — DX: Major depressive disorder, single episode, unspecified: F32.9

## 2015-12-23 HISTORY — DX: Depression, unspecified: F32.A

## 2015-12-23 HISTORY — DX: Attention-deficit hyperactivity disorder, unspecified type: F90.9

## 2015-12-23 LAB — COMPREHENSIVE METABOLIC PANEL
ALK PHOS: 77 U/L (ref 38–126)
ALT: 118 U/L — AB (ref 17–63)
AST: 173 U/L — ABNORMAL HIGH (ref 15–41)
Albumin: 4.5 g/dL (ref 3.5–5.0)
Anion gap: 9 (ref 5–15)
BILIRUBIN TOTAL: 0.8 mg/dL (ref 0.3–1.2)
BUN: 10 mg/dL (ref 6–20)
CALCIUM: 8.7 mg/dL — AB (ref 8.9–10.3)
CO2: 27 mmol/L (ref 22–32)
CREATININE: 0.86 mg/dL (ref 0.61–1.24)
Chloride: 108 mmol/L (ref 101–111)
Glucose, Bld: 98 mg/dL (ref 65–99)
Potassium: 3.6 mmol/L (ref 3.5–5.1)
Sodium: 144 mmol/L (ref 135–145)
Total Protein: 7.2 g/dL (ref 6.5–8.1)

## 2015-12-23 LAB — ACETAMINOPHEN LEVEL: Acetaminophen (Tylenol), Serum: <10 ug/mL — ABNORMAL LOW (ref 10–30)

## 2015-12-23 LAB — RAPID URINE DRUG SCREEN, HOSP PERFORMED
Amphetamines: NOT DETECTED
Barbiturates: NOT DETECTED
Benzodiazepines: POSITIVE — AB
Cocaine: NOT DETECTED
OPIATES: NOT DETECTED
TETRAHYDROCANNABINOL: POSITIVE — AB

## 2015-12-23 LAB — ETHANOL
ALCOHOL ETHYL (B): 407 mg/dL — AB (ref <5)
Alcohol, Ethyl (B): 40 mg/dL — ABNORMAL HIGH (ref <5)

## 2015-12-23 LAB — SALICYLATE LEVEL

## 2015-12-23 LAB — CBC
HCT: 44.9 % (ref 39.0–52.0)
HEMOGLOBIN: 16.2 g/dL (ref 13.0–17.0)
MCH: 37.4 pg — AB (ref 26.0–34.0)
MCHC: 36.1 g/dL — AB (ref 30.0–36.0)
MCV: 103.7 fL — ABNORMAL HIGH (ref 78.0–100.0)
Platelets: 177 10*3/uL (ref 150–400)
RBC: 4.33 MIL/uL (ref 4.22–5.81)
RDW: 12.7 % (ref 11.5–15.5)
WBC: 5.6 10*3/uL (ref 4.0–10.5)

## 2015-12-23 MED ORDER — VITAMIN B-1 100 MG PO TABS
100.0000 mg | ORAL_TABLET | Freq: Every day | ORAL | Status: DC
  Administered 2015-12-23: 100 mg via ORAL

## 2015-12-23 MED ORDER — ACETAMINOPHEN 325 MG PO TABS: 650.0000 mg | ORAL_TABLET | ORAL | Status: DC | PRN

## 2015-12-23 MED ORDER — ONDANSETRON HCL 4 MG PO TABS
4.0000 mg | ORAL_TABLET | Freq: Three times a day (TID) | ORAL | Status: DC | PRN
  Administered 2015-12-23: 4 mg via ORAL
  Filled 2015-12-23: qty 1

## 2015-12-23 MED ORDER — TRAZODONE HCL 50 MG PO TABS
50.0000 mg | ORAL_TABLET | Freq: Every evening | ORAL | Status: DC | PRN
  Filled 2015-12-23: qty 7

## 2015-12-23 MED ORDER — LORAZEPAM 1 MG PO TABS
0.0000 mg | ORAL_TABLET | Freq: Four times a day (QID) | ORAL | Status: DC
  Administered 2015-12-23: 1 mg via ORAL
  Filled 2015-12-23: qty 1

## 2015-12-23 MED ORDER — NICOTINE 21 MG/24HR TD PT24
21.0000 mg | MEDICATED_PATCH | Freq: Every day | TRANSDERMAL | Status: DC
  Administered 2015-12-24 – 2015-12-26 (×3): 21 mg via TRANSDERMAL
  Filled 2015-12-23 (×5): qty 1

## 2015-12-23 MED ORDER — SODIUM CHLORIDE 0.9 % IV BOLUS (SEPSIS)
500.0000 mL | Freq: Once | INTRAVENOUS | Status: AC
  Administered 2015-12-23: 500 mL via INTRAVENOUS

## 2015-12-23 MED ORDER — NICOTINE 21 MG/24HR TD PT24
21.0000 mg | MEDICATED_PATCH | Freq: Every day | TRANSDERMAL | Status: DC
  Administered 2015-12-23: 21 mg via TRANSDERMAL

## 2015-12-23 MED ORDER — VITAMIN B-1 100 MG PO TABS
100.0000 mg | ORAL_TABLET | Freq: Every day | ORAL | Status: DC
  Administered 2015-12-24 – 2015-12-26 (×3): 100 mg via ORAL
  Filled 2015-12-23 (×5): qty 1

## 2015-12-23 MED ORDER — THIAMINE HCL 100 MG/ML IJ SOLN: 100.0000 mg | Freq: Every day | INTRAMUSCULAR | Status: DC

## 2015-12-23 MED ORDER — LORAZEPAM 1 MG PO TABS: 0.0000 mg | ORAL_TABLET | Freq: Two times a day (BID) | ORAL | Status: DC

## 2015-12-23 MED ORDER — ALUM & MAG HYDROXIDE-SIMETH 200-200-20 MG/5ML PO SUSP: 30.0000 mL | ORAL | Status: DC | PRN

## 2015-12-23 MED ORDER — NICOTINE 21 MG/24HR TD PT24
21.0000 mg | MEDICATED_PATCH | Freq: Every day | TRANSDERMAL | Status: DC
  Administered 2015-12-23: 21 mg via TRANSDERMAL
  Filled 2015-12-23 (×2): qty 1

## 2015-12-23 MED ORDER — VITAMIN B-1 100 MG PO TABS
100.0000 mg | ORAL_TABLET | Freq: Every day | ORAL | Status: DC
  Filled 2015-12-23: qty 1

## 2015-12-23 MED ORDER — CLONAZEPAM 0.5 MG PO TABS
1.0000 mg | ORAL_TABLET | Freq: Three times a day (TID) | ORAL | Status: DC | PRN
  Administered 2015-12-23 (×2): 1 mg via ORAL
  Filled 2015-12-23 (×2): qty 2

## 2015-12-23 MED ORDER — LORAZEPAM 1 MG PO TABS
0.0000 mg | ORAL_TABLET | Freq: Four times a day (QID) | ORAL | Status: DC
Start: 2015-12-23 — End: 2015-12-23
  Administered 2015-12-23: 1 mg via ORAL
  Administered 2015-12-23: 2 mg via ORAL
  Filled 2015-12-23: qty 2
  Filled 2015-12-23: qty 1

## 2015-12-23 MED ORDER — MAGNESIUM HYDROXIDE 400 MG/5ML PO SUSP: 30.0000 mL | Freq: Every day | ORAL | Status: DC | PRN

## 2015-12-23 MED ORDER — CLONAZEPAM 1 MG PO TABS
1.0000 mg | ORAL_TABLET | Freq: Three times a day (TID) | ORAL | Status: DC | PRN
  Administered 2015-12-24: 1 mg via ORAL
  Filled 2015-12-23: qty 1

## 2015-12-23 MED ORDER — ONDANSETRON HCL 4 MG PO TABS
4.0000 mg | ORAL_TABLET | Freq: Three times a day (TID) | ORAL | Status: DC | PRN
Start: 2015-12-23 — End: 2015-12-26

## 2015-12-23 MED ORDER — IBUPROFEN 200 MG PO TABS: 600.0000 mg | ORAL_TABLET | Freq: Three times a day (TID) | ORAL | Status: DC | PRN

## 2015-12-23 MED ORDER — ZOLPIDEM TARTRATE 5 MG PO TABS: 5.0000 mg | ORAL_TABLET | Freq: Every evening | ORAL | Status: DC | PRN

## 2015-12-23 NOTE — ED Notes (Signed)
Pt refused Ativan, pt refused vital signs; pt states "Fuck it, I am done. I want my clothes, I want my phone and I am leaving"; pt unwilling to listen or talk to RN; pt states "I am done with this bullshit"; mother at bedside states she is fine with taking pt home

## 2015-12-23 NOTE — ED Notes (Signed)
Pt stated unable to give urine sample at this time, urinal and call light at bedside. Mother at bedside.

## 2015-12-23 NOTE — ED Notes (Signed)
Pt wants to stay and get help; pt states "I will take the Ativan now"

## 2015-12-23 NOTE — ED Notes (Signed)
Bed: WA22 Expected date:  Expected time:  Means of arrival:  Comments: 
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
## 2015-12-23 NOTE — ED Notes (Signed)
Pt arrives to the ER for complaints of feeling aggressive; pt states that he has been getting angry and breaking things; pt states that he got mad and quit his job today; pt states that he feels like he may hurt someone; pt denies feeling homicidal towards anyone in particular; pt states that it just happens and he gets upset; pt states that he need help with ETOH and drug addiction; pt states that he has been doing cocaine more frequently and has been doing a 8 ball a day; pt states that he smoke 3.5 grams of weed everyday; pt states that he feels like he has hit bottom and needs help; pt [facility] and [facility] advised him to come here for evaluation; pt denies SI

## 2015-12-23 NOTE — Tx Team (Signed)
Initial Interdisciplinary Treatment Plan   PATIENT STRESSORS: Educational concerns Financial difficulties Health problems Legal issue Marital or family conflict   PATIENT STRENGTHS: Ability for insight Active sense of humor Average or above average intelligence Capable of independent living Communication skills   PROBLEM LIST: Problem List/Patient Goals Date to be addressed Date deferred Reason deferred Estimated date of resolution  MDD 12/23/15     Withdrawal seizures 12/23/15                       " I like life" 12/23/15     " I want to get back to my life" 12/23/15                  DISCHARGE CRITERIA:  Ability to meet basic life and health needs Adequate post-discharge living arrangements Improved stabilization in mood, thinking, and/or behavior Medical problems require only outpatient monitoring Motivation to continue treatment in a less acute level of care  PRELIMINARY DISCHARGE PLAN: Attend aftercare/continuing care group Attend 12-step recovery group Outpatient therapy Participate in family therapy Placement in alternative living arrangements  PATIENT/FAMIILY INVOLVEMENT: This treatment plan has been presented to and reviewed with the patient, Jerry SchmidZachary Carlin, and/or family member, .  The patient and family have been given the opportunity to ask questions and make suggestions.  Rich BraveDuke, Seraphine Gudiel Lynn 12/23/2015, 7:05 PM

## 2015-12-23 NOTE — ED Notes (Signed)
Pt states his last alcoholic drink was yesterday evening. Pt states hx of alcohol withdrawal s/s. Pt states "If I dont drink in the AM by 11am I begin to shake"

## 2015-12-23 NOTE — ED Notes (Signed)
Psychiatry at bedside.

## 2015-12-23 NOTE — Consult Note (Signed)
[facility] Psychiatry Consult   Reason for Consult:  Polysubstance abuse and requesting detox assistance Referring Physician:  EDP Patient Identification: Jerry Gray MRN:  [id number] Principal Diagnosis: MDD (major depressive disorder), recurrent severe, without psychosis ([place]) Diagnosis:   Patient Active Problem List   Diagnosis Date Noted  . Polysubstance dependence including opioid type drug, episodic abuse ([place]) [F11.20, F19.20] 12/23/2015  . Alcohol dependence with alcohol-induced mood disorder ([place]) [F10.24] 12/23/2015  . MDD (major depressive disorder), recurrent severe, without psychosis ([place]) [F33.2] 12/23/2015  . Alcohol abuse [F10.10] 08/24/2015  . Elevated LFTs [R79.89] 08/24/2015  . Mass of forearm [R22.30] 08/24/2015    Total Time spent with patient: 30 minutes  Subjective:   Jerry Gray is a 30 y.o. male patient presented to [facility] intoxicated; and polysubstance abuse requesting assistance with detox.  HPI:  Patient states that he has a long history of polysubstance abuse (opiates, THC, Alcohol).  Patient states that he is drinking whiskey everyday morning to night.  States that he has to "take a shot when I first wake up in the morning cause of the shaking."  Patient denies suicidal/homicidal ideation, psychosis, and paranoia.    Past Psychiatric History: ADHD, Bipolar Disorder, PTSD, Alcohol dependence, and Polysubstance Abuse; prior inpatient and outpatient services but currently being treated by PCP  Risk to Self: Is patient at risk for suicide?: No, but patient needs Medical Clearance Risk to Others:   Prior Inpatient Therapy:   Prior Outpatient Therapy:    Past Medical History:  Past Medical History  Diagnosis Date  . Arthritis   . Substance abuse   . Seizures ([place])   . Neuromuscular disorder ([place])   . ADHD (attention deficit hyperactivity disorder)   . OCD (obsessive compulsive disorder)   . Depression   . Suicidal thoughts    History reviewed.  No pertinent past surgical history. Family History: History reviewed. No pertinent family history. Family Psychiatric  History: Denies family history of mental illness Social History:  History  Alcohol Use  . 96.6 oz/week  . 21 Cans of beer, 0 Standard drinks or equivalent, 140 Shots of liquor per week    Comment: pt states a fifth a day     History  Drug Use  . Yes  . Special: Marijuana, Cocaine    Social History   Social History  . Marital Status: Single    Spouse Name: N/A  . Number of Children: N/A  . Years of Education: N/A   Social History Main Topics  . Smoking status: Current Every Day Smoker -- 1.50 packs/day    Types: Cigarettes  . Smokeless tobacco: None  . Alcohol Use: 96.6 oz/week    21 Cans of beer, 0 Standard drinks or equivalent, 140 Shots of liquor per week     Comment: pt states a fifth a day  . Drug Use: Yes    Special: Marijuana, Cocaine  . Sexual Activity: Not Asked   Other Topics Concern  . None   Social History Narrative   Additional Social History:    Allergies:   Allergies  Allergen Reactions  . Ciprofloxacin Diarrhea and Nausea And Vomiting  . Penicillins Anaphylaxis    Has patient had a PCN reaction causing immediate rash, facial/tongue/throat swelling, SOB or lightheadedness with hypotension:Yes Has patient had a PCN reaction causing severe rash involving mucus membranes or skin necrosis:No Has patient had a PCN reaction that required hospitalization:No Has patient had a PCN reaction occurring within the last 10 years:No If all  of the above answers are "NO", then may proceed with Cephalosporin use.     Labs:  Results for orders placed or performed during the hospital encounter of 12/23/15 (from the past 48 hour(s))  Comprehensive metabolic panel     Status: Abnormal   Collection Time: 12/23/15  1:32 AM  Result Value Ref Range   Sodium 144 135 - 145 mmol/L   Potassium 3.6 3.5 - 5.1 mmol/L   Chloride 108 101 - 111 mmol/L   CO2  27 22 - 32 mmol/L   Glucose, Bld 98 65 - 99 mg/dL   BUN 10 6 - 20 mg/dL   Creatinine, Ser 0.86 0.61 - 1.24 mg/dL   Calcium 8.7 (L) 8.9 - 10.3 mg/dL   Total Protein 7.2 6.5 - 8.1 g/dL   Albumin 4.5 3.5 - 5.0 g/dL   AST 173 (H) 15 - 41 U/L   ALT 118 (H) 17 - 63 U/L   Alkaline Phosphatase 77 38 - 126 U/L   Total Bilirubin 0.8 0.3 - 1.2 mg/dL   GFR calc non Af Amer >60 >60 mL/min   GFR calc Af Amer >60 >60 mL/min    Comment: (NOTE) The eGFR has been calculated using the CKD EPI equation. This calculation has not been validated in all clinical situations. eGFR's persistently <60 mL/min signify possible Chronic Kidney Disease.    Anion gap 9 5 - 15  Ethanol     Status: Abnormal   Collection Time: 12/23/15  1:32 AM  Result Value Ref Range   Alcohol, Ethyl (B) 407 (HH) <5 mg/dL    Comment:        LOWEST DETECTABLE LIMIT FOR SERUM ALCOHOL IS 5 mg/dL FOR MEDICAL PURPOSES ONLY CRITICAL RESULT CALLED TO, READ BACK BY AND VERIFIED WITH: Candiss Norse RN [id number] 11/91/47 A NAVARRO   Salicylate level     Status: None   Collection Time: 12/23/15  1:32 AM  Result Value Ref Range   Salicylate Lvl <8.2 2.8 - 30.0 mg/dL  Acetaminophen level     Status: Abnormal   Collection Time: 12/23/15  1:32 AM  Result Value Ref Range   Acetaminophen (Tylenol), Serum <10 (L) 10 - 30 ug/mL    Comment:        THERAPEUTIC CONCENTRATIONS VARY SIGNIFICANTLY. A RANGE OF 10-30 ug/mL MAY BE AN EFFECTIVE CONCENTRATION FOR MANY PATIENTS. HOWEVER, SOME ARE BEST TREATED AT CONCENTRATIONS OUTSIDE THIS RANGE. ACETAMINOPHEN CONCENTRATIONS >150 ug/mL AT 4 HOURS AFTER INGESTION AND >50 ug/mL AT 12 HOURS AFTER INGESTION ARE OFTEN ASSOCIATED WITH TOXIC REACTIONS.   cbc     Status: Abnormal   Collection Time: 12/23/15  1:32 AM  Result Value Ref Range   WBC 5.6 4.0 - 10.5 K/uL   RBC 4.33 4.22 - 5.81 MIL/uL   Hemoglobin 16.2 13.0 - 17.0 g/dL   HCT 44.9 39.0 - 52.0 %   MCV 103.7 (H) 78.0 - 100.0 fL   MCH 37.4 (H) 26.0  - 34.0 pg   MCHC 36.1 (H) 30.0 - 36.0 g/dL   RDW 12.7 11.5 - 15.5 %   Platelets 177 150 - 400 K/uL  Rapid urine drug screen (hospital performed)     Status: Abnormal   Collection Time: 12/23/15 12:54 PM  Result Value Ref Range   Opiates NONE DETECTED NONE DETECTED   Cocaine NONE DETECTED NONE DETECTED   Benzodiazepines POSITIVE (A) NONE DETECTED   Amphetamines NONE DETECTED NONE DETECTED   Tetrahydrocannabinol POSITIVE (A) NONE DETECTED   Barbiturates NONE  DETECTED NONE DETECTED    Comment:        DRUG SCREEN FOR MEDICAL PURPOSES ONLY.  IF CONFIRMATION IS NEEDED FOR ANY PURPOSE, NOTIFY LAB WITHIN 5 DAYS.        LOWEST DETECTABLE LIMITS FOR URINE DRUG SCREEN Drug Class       Cutoff (ng/mL) Amphetamine      1000 Barbiturate      200 Benzodiazepine   944 Tricyclics       967 Opiates          300 Cocaine          300 THC              50     Current Facility-Administered Medications  Medication Dose Route Frequency Provider Last Rate Last Dose  . acetaminophen (TYLENOL) tablet 650 mg  650 mg Oral Q4H PRN Delos Haring, PA-C      . alum & mag hydroxide-simeth (MAALOX/MYLANTA) 200-200-20 MG/5ML suspension 30 mL  30 mL Oral PRN Delos Haring, PA-C      . clonazePAM (KLONOPIN) tablet 1 mg  1 mg Oral TID PRN Delos Haring, PA-C   1 mg at 12/23/15 0255  . ibuprofen (ADVIL,MOTRIN) tablet 600 mg  600 mg Oral Q8H PRN Delos Haring, PA-C      . LORazepam (ATIVAN) tablet 0-4 mg  0-4 mg Oral Q6H Tiffany Greene, PA-C   1 mg at 12/23/15 1458   Followed by  . [START ON 12/25/2015] LORazepam (ATIVAN) tablet 0-4 mg  0-4 mg Oral Q12H Tiffany Greene, PA-C      . nicotine (NICODERM CQ - dosed in mg/24 hours) patch 21 mg  21 mg Transdermal Daily Delos Haring, PA-C   Stopped at 12/23/15 1142  . nicotine (NICODERM CQ - dosed in mg/24 hours) patch 21 mg  21 mg Transdermal Daily Delos Haring, PA-C   21 mg at 12/23/15 1141  . ondansetron (ZOFRAN) tablet 4 mg  4 mg Oral Q8H PRN Delos Haring, PA-C    4 mg at 12/23/15 1457  . thiamine (VITAMIN B-1) tablet 100 mg  100 mg Oral Daily Delos Haring, PA-C   100 mg at 12/23/15 0930   Or  . thiamine (B-1) injection 100 mg  100 mg Intravenous Daily Delos Haring, PA-C      . thiamine (VITAMIN B-1) tablet 100 mg  100 mg Oral Daily Delos Haring, PA-C   100 mg at 12/23/15 5916   Or  . thiamine (B-1) injection 100 mg  100 mg Intravenous Daily Delos Haring, PA-C      . zolpidem (AMBIEN) tablet 5 mg  5 mg Oral QHS PRN Delos Haring, PA-C       Current Outpatient Prescriptions  Medication Sig Dispense Refill  . chlorproMAZINE (THORAZINE) 50 MG tablet Take 50 mg by mouth 3 (three) times daily as needed (anxiety.).     Marland Kitchen clonazePAM (KLONOPIN) 1 MG tablet Take 1 mg by mouth 3 (three) times daily as needed for anxiety.     Marland Kitchen ibuprofen (ADVIL,MOTRIN) 200 MG tablet Take 400-800 mg by mouth every 6 (six) hours as needed for moderate pain.    Marland Kitchen zolpidem (AMBIEN) 10 MG tablet Take 10 mg by mouth at bedtime as needed for sleep.    . fluticasone (FLONASE) 50 MCG/ACT nasal spray Place 2 sprays into both nostrils daily. (Patient not taking: Reported on 12/23/2015) 16 g 6  . oxyCODONE (ROXICODONE) 5 MG immediate release tablet Take 1-2 tablets (5-10 mg total) by mouth  every 4 (four) hours as needed for moderate pain or severe pain. (Patient not taking: Reported on 12/23/2015) 15 tablet 0    Musculoskeletal: Strength & Muscle Tone: within normal limits Gait & Station: normal Patient leans: N/A  Psychiatric Specialty Exam: Physical Exam  Nursing note and vitals reviewed. Constitutional: He is oriented to person, place, and time.  Neck: Normal range of motion.  Respiratory: Effort normal.  Musculoskeletal: Normal range of motion.  Neurological: He is alert and oriented to person, place, and time.  Psychiatric: His speech is normal. His mood appears anxious. He is agitated. He is not actively hallucinating. Thought content is not paranoid and not  delusional. Cognition and memory are normal. He expresses impulsivity. He exhibits a depressed mood. He expresses no homicidal and no suicidal ideation.    Review of Systems  Neurological: Positive for tremors.  Psychiatric/Behavioral: Positive for depression and substance abuse. Negative for suicidal ideas and hallucinations. The patient is nervous/anxious and has insomnia.   All other systems reviewed and are negative.   Blood pressure 146/112, pulse 103, temperature 97.8 F (36.6 C), temperature source Oral, resp. rate 18, height 5' 10"  (1.778 m), weight 81.647 kg (180 lb), SpO2 99 %.Body mass index is 25.83 kg/(m^2).  General Appearance: Disheveled  Eye Contact:  None  Speech:  Clear and Coherent  Volume:  Decreased  Mood:  Anxious, Depressed, Hopeless and Irritable  Affect:  Depressed and Flat  Thought Process:  Goal Directed  Orientation:  Full (Time, Place, and Person)  Thought Content:  Denies hallucinations, delusions, and paranoia  Suicidal Thoughts:  No  Homicidal Thoughts:  No  Memory:  Immediate;   Fair Recent;   Fair Remote;   Fair  Judgement:  Poor  Insight:  Lacking  Psychomotor Activity:  Tremor  Concentration:  Concentration: Fair and Attention Span: Fair  Recall:  [employer] of Knowledge:  Fair  Language:  Good  Akathisia:  No  Handed:  Right  AIMS (if indicated):     Assets:  Communication Skills Desire for Improvement Housing Social Support  ADL's:  Intact  Cognition:  WNL  Sleep:        Treatment Plan Summary: Daily contact with patient to assess and evaluate symptoms and progress in treatment, Medication management and Plan Inpatient treatment recommended   Start Ativan Detox protocol;  Continue home medications  Disposition: Recommend psychiatric Inpatient admission when medically cleared.  Earleen Newport, NP 12/23/2015 3:40 PM   Patient seen and I agree with treatment and plan  Griffin Dakin.D.

## 2015-12-23 NOTE — Progress Notes (Signed)
Jerry Gray is a very anxious cuacsian 30 yo male who is [facility] the [facility]..after going there earleir today..requesting help because of his substance abuse issues. HE is rigid and still and quite pressured as this nurse asks admission questions. 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 He says he got po'd at his boss earlier today and said " you now what..ibuprofen quit". He shares he does have anger problems but this is all he will disclose at this point. After pt answered page 1 of Suicide Safety Plan, answers are documented by this nurse, admission completed and pt oriented to the unit.

## 2015-12-24 DIAGNOSIS — F1024 Alcohol dependence with alcohol-induced mood disorder: Principal | ICD-10-CM

## 2015-12-24 DIAGNOSIS — F10239 Alcohol dependence with withdrawal, unspecified: Secondary | ICD-10-CM

## 2015-12-24 DIAGNOSIS — F10939 Alcohol use, unspecified with withdrawal, unspecified: Secondary | ICD-10-CM

## 2015-12-24 DIAGNOSIS — F332 Major depressive disorder, recurrent severe without psychotic features: Secondary | ICD-10-CM

## 2015-12-24 DIAGNOSIS — F10129 Alcohol abuse with intoxication, unspecified: Secondary | ICD-10-CM

## 2015-12-24 MED ORDER — HYDROXYZINE HCL 25 MG PO TABS
25.0000 mg | ORAL_TABLET | Freq: Four times a day (QID) | ORAL | Status: DC | PRN
  Administered 2015-12-25: 25 mg via ORAL
  Filled 2015-12-24: qty 1

## 2015-12-24 MED ORDER — ONDANSETRON 4 MG PO TBDP: 4.0000 mg | ORAL_TABLET | Freq: Four times a day (QID) | ORAL | Status: DC | PRN

## 2015-12-24 MED ORDER — LORAZEPAM 1 MG PO TABS
1.0000 mg | ORAL_TABLET | Freq: Four times a day (QID) | ORAL | Status: AC
  Administered 2015-12-24 (×4): 1 mg via ORAL
  Filled 2015-12-24 (×4): qty 1

## 2015-12-24 MED ORDER — LORAZEPAM 1 MG PO TABS
1.0000 mg | ORAL_TABLET | Freq: Two times a day (BID) | ORAL | Status: DC
  Administered 2015-12-26: 1 mg via ORAL
  Filled 2015-12-24: qty 1

## 2015-12-24 MED ORDER — LOPERAMIDE HCL 2 MG PO CAPS
2.0000 mg | ORAL_CAPSULE | ORAL | Status: DC | PRN
Start: 2015-12-24 — End: 2015-12-26

## 2015-12-24 MED ORDER — LORAZEPAM 1 MG PO TABS: 1.0000 mg | ORAL_TABLET | Freq: Every day | ORAL | Status: DC

## 2015-12-24 MED ORDER — IBUPROFEN 800 MG PO TABS
800.0000 mg | ORAL_TABLET | Freq: Three times a day (TID) | ORAL | Status: DC | PRN
  Administered 2015-12-24 – 2015-12-26 (×4): 800 mg via ORAL
  Filled 2015-12-24 (×4): qty 1

## 2015-12-24 MED ORDER — ADULT MULTIVITAMIN W/MINERALS CH
1.0000 | ORAL_TABLET | Freq: Every day | ORAL | Status: DC
  Administered 2015-12-24 – 2015-12-26 (×3): 1 via ORAL
  Filled 2015-12-24 (×5): qty 1

## 2015-12-24 MED ORDER — LORAZEPAM 1 MG PO TABS
1.0000 mg | ORAL_TABLET | Freq: Three times a day (TID) | ORAL | Status: AC
  Administered 2015-12-25 (×3): 1 mg via ORAL
  Filled 2015-12-24 (×3): qty 1

## 2015-12-24 NOTE — [facility] Group Notes (Signed)
[facility] LCSW Aftercare Discharge Planning Group Note  12/24/2015  8:45 AM  Participation Quality: Did Not Attend. Patient invited to participate but declined.  Keyler Hoge, MSW, LCSW Clinical Social Worker [facility] [phone]   

## 2015-12-24 NOTE — [facility] Group Notes (Signed)
[facility] LCSW Group Therapy 12/24/2015  1:15 PM   Type of Therapy: Group Therapy  Participation Level: Did Not Attend. Patient invited to participate but declined.   Samuella BruinKristin Amour Cutrone, MSW, LCSW Clinical Social Worker [facility] [phone]

## 2015-12-24 NOTE — Progress Notes (Signed)
Writer spoke with patient 1:1 and he reports that he has had very little sleep since being across the street at the ED. He received his scheduled ativan and Clinical research associatewriter informed him of other medications available if needed. He was also informed of AA meeting taking place but he wants to rest and start out fresh tomorrow. Support given and safety maintained on unit with 15 min checks.

## 2015-12-24 NOTE — Progress Notes (Signed)
D: Patient is isolative to room.  He was started on the ativan protocol.  Patient was concerned about his klonopin being discontinued.  Explained to him that he is taking ativan which is a benzodiazepine.  He states, "I was taking that for seizures.  I don't want to have a seizure."  Patient does have hx of alcohol withdrawal seizures.  He has minimal withdrawal symptoms, listing "tremors."  He rates his depression and hopelessness as a 0; anxiety as a 1.  He states he did not sleep well last night.  His goal today is "to feel normal without whiskey."  He denies any withdrawal symptoms or HI/AVH. A: Continue to monitor medication management and MD orders.  Safety checks completed every 15 minutes per protocol.  Offer support and encouragement as needed. R: Patient is receptive to staff; his behavior is appropriate.
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
## 2015-12-24 NOTE — Progress Notes (Signed)
Patient did not attend the evening speaker AA meeting. Pt was notified that group was beginning but returned to his room.   

## 2015-12-24 NOTE — [facility] Suicide Risk Assessment (Signed)
[facility]   Nursing information obtained from:    Demographic factors:    Current Mental Status:    Loss Factors:    Historical Factors:    Risk Reduction Factors:     Total Time spent with patient: 1 hour Principal Problem: Alcohol dependence with alcohol-induced mood disorder ([facility]) Diagnosis:   Patient Active Problem List   Diagnosis Date Noted  . Alcohol abuse with intoxication ([facility]) [F10.129] 12/24/2015  . Alcohol withdrawal ([facility]) [F10.239] 12/24/2015  . Polysubstance dependence including opioid type drug, episodic abuse ([facility]) [F11.20, F19.20] 12/23/2015  . Alcohol dependence with alcohol-induced mood disorder ([facility]) [F10.24] 12/23/2015  . MDD (major depressive disorder), recurrent severe, without psychosis ([facility]) [F33.2] 12/23/2015  . Aggression [F60.89]   . Alcohol abuse [F10.10] 08/24/2015  . Elevated LFTs [R79.89] 08/24/2015  . Mass of forearm [R22.30] 08/24/2015   Subjective Data: Patient presented with alcohol intoxication, withdrawal symptoms, depression with agitation. Patient is seeking alcohol detox treatment and worried about getting hurt if does not get help. He was diagnosed with Tourette's, ADHD, OCD, polysubstance abuse and did not follow through treatment recommendation and self medicating over three years.   Continued Clinical Symptoms:  Alcohol Use Disorder Identification Test Final Score (AUDIT): 13 The "Alcohol Use Disorders Identification Test", Guidelines for Use in Primary Care, Second Edition.  World [employer] [facility](WHO). Score between 0-7:  no or low risk or alcohol related problems. Score between 8-15:  moderate risk of alcohol related problems. Score between 16-19:  high risk of alcohol related problems. Score 20 or above:  warrants further diagnostic evaluation for alcohol dependence and treatment.   CLINICAL FACTORS:   Severe Anxiety and/or Agitation Depression:   Aggression Anhedonia Comorbid alcohol  abuse/dependence Hopelessness Impulsivity Recent sense of peace/wellbeing Severe Alcohol/Substance Abuse/Dependencies Chronic Pain More than one psychiatric diagnosis Unstable or Poor Therapeutic Relationship Previous Psychiatric Diagnoses and Treatments Medical Diagnoses and Treatments/Surgeries   Musculoskeletal: Strength & Muscle Tone: decreased Gait & Station: normal Patient leans: N/A  Psychiatric Specialty Exam: Physical Exam Full physical performed in Emergency Department. I have reviewed this assessment and concur with its findings.   ROS tired, fatigue, restless, nausea and vomiting. He was dehydrated and required rehydration and disturbed mood and sleep. He has horizontal bilateral nystagmus. No Fever-chills, No Headache, No changes with Vision or hearing, reports vertigo No problems swallowing food or Liquids, No Chest pain, Cough or Shortness of Breath, No Abdominal pain, No Nausea or Vommitting, Bowel movements are regular, No Blood in stool or Urine, No dysuria, No new skin rashes or bruises, No new joints pains-aches,  No new weakness, tingling, numbness in any extremity, No recent weight gain or loss, No polyuria, polydypsia or polyphagia,   A full 10 point Review of Systems was done, except as stated above, all other Review of Systems were negative.  Blood pressure 136/89, pulse 89, temperature 98.2 F (36.8 C), temperature source Oral, resp. rate 16, height 5\' 11"  (1.803 m), weight 78.019 kg (172 lb), SpO2 100 %.Body mass index is 24 kg/(m^2).  General Appearance: Guarded  Eye Contact:  Good  Speech:  Clear and Coherent  Volume:  Decreased  Mood:  Angry, Anxious and Depressed  Affect:  Constricted and Depressed  Thought Process:  Coherent and Goal Directed  Orientation:  Full (Time, Place, and Person)  Thought Content:  Logical  Suicidal Thoughts:  No  Homicidal Thoughts:  No  Memory:  Immediate;   Good Recent;   Fair Remote;  Fair  Judgement:   Impaired  Insight:  Fair  Psychomotor Activity:  Decreased and Restlessness  Concentration:  Concentration: Fair and Attention Span: Fair  Recall:  [employer]  Fund of Knowledge:  Good  Language:  Good  Akathisia:  Negative  Handed:  Right  AIMS (if indicated):     Assets:  Communication Skills Desire for Improvement Housing Leisure Time Resilience Social Support Talents/Skills Transportation Vocational/Educational  ADL's:  Intact  Cognition:  WNL  Sleep:  Number of Hours: 6      COGNITIVE FEATURES THAT CONTRIBUTE TO RISK:  Polarized thinking    SUICIDE RISK:   Minimal: No identifiable suicidal ideation.  Patients presenting with no risk factors but with morbid ruminations; may be classified as minimal risk based on the severity of the depressive symptoms  PLAN OF CARE: Admitted for Ativan alcohol detox treatment, supportive therapy and monitor for withdrawal symptoms and seizures.   I certify that inpatient services furnished can reasonably be expected to improve the patient's condition.   Leata MouseJANARDHANA Arneda [place], MD 12/24/2015, 11:45 AM

## 2015-12-24 NOTE — BH Assessment (Addendum)
Tele Assessment Note   Jerry Gray is a 30 y.o. male who presented voluntarily to [facility] on 12/23/2015 due to increasing anger and alcohol/drug abuse. Pt indicated that he was scared that, if he didn't get help, he might hurt someone.   Diagnosis: Alcohol Induced depressive disorder  Past Medical History:  Past Medical History  Diagnosis Date  . Arthritis   . Substance abuse   . Seizures (HCC)   . Neuromuscular disorder (HCC)   . ADHD (attention deficit hyperactivity disorder)   . OCD (obsessive compulsive disorder)   . Depression   . Suicidal thoughts     History reviewed. No pertinent past surgical history.  Family History: History reviewed. No pertinent family history.  Social History:  reports that he has been smoking Cigarettes.  He has a 15 pack-year smoking history. He does not have any smokeless tobacco history on file. He reports that he drinks about 96.6 oz of alcohol per week. He reports that he uses illicit drugs (Marijuana and Cocaine) about 5 times per week.  Additional Social History:  Alcohol / Drug Use Pain Medications: see PTA meds Prescriptions: see PTA meds Over the Counter: see PTA meds History of alcohol / drug use?: Yes Negative Consequences of Use: Legal, Personal relationships, Financial Withdrawal Symptoms: Agitation, Aggressive/Assaultive Substance #1 Name of Substance 1: Alcohol 1 - Amount (size/oz): 1/5 of whiskey or more 1 - Frequency: daily 1 - Duration: for at least a year 1 - Last Use / Amount: 12/22/15 Substance #2 Name of Substance 2: Cocaine 2 - Amount (size/oz): pt reports using an 8 ball/day but tested negative for cocaine Substance #3 Name of Substance 3: THC 3 - Amount (size/oz): 3.5 grams 3 - Frequency: daily 3 - Duration: ongoing  CIWA: CIWA-Ar BP: 136/89 mmHg Pulse Rate: 89 Nausea and Vomiting: no nausea and no vomiting Tactile Disturbances: none Tremor: no tremor Auditory Disturbances: not present Paroxysmal Sweats: no  sweat visible Visual Disturbances: not present Anxiety: mildly anxious Headache, Fullness in Head: none present Agitation: normal activity Orientation and Clouding of Sensorium: oriented and can do serial additions CIWA-Ar Total: 1 COWS: Clinical Opiate Withdrawal Scale (COWS) Resting Pulse Rate: Pulse Rate 101-120 Sweating: No report of chills or flushing Restlessness: Reports difficulty sitting still, but is able to do so Pupil Size: Pupils pinned or normal size for room light Bone or Joint Aches: Not present Runny Nose or Tearing: Not present GI Upset: No GI symptoms Tremor: Tremor can be felt, but not observed Yawning: No yawning Anxiety or Irritability: None Gooseflesh Skin: Skin is smooth COWS Total Score: 4  PATIENT STRENGTHS: (choose at least two) Average or above average intelligence Capable of independent living Supportive family/friends  Allergies:  Allergies  Allergen Reactions  . Ciprofloxacin Diarrhea and Nausea And Vomiting  . Penicillins Anaphylaxis    Has patient had a PCN reaction causing immediate rash, facial/tongue/throat swelling, SOB or lightheadedness with hypotension:Yes Has patient had a PCN reaction causing severe rash involving mucus membranes or skin necrosis:No Has patient had a PCN reaction that required hospitalization:No Has patient had a PCN reaction occurring within the last 10 years:No If all of the above answers are "NO", then may proceed with Cephalosporin use.     Home Medications:  Medications Prior to Admission  Medication Sig Dispense Refill  . chlorproMAZINE (THORAZINE) 50 MG tablet Take 50 mg by mouth 3 (three) times daily as needed (anxiety.).     Marland Kitchen clonazePAM (KLONOPIN) 1 MG tablet Take 1 mg by mouth  3 (three) times daily as needed for anxiety.     . fluticasone (FLONASE) 50 MCG/ACT nasal spray Place 2 sprays into both nostrils daily. (Patient not taking: Reported on 12/23/2015) 16 g 6  . ibuprofen (ADVIL,MOTRIN) 200 MG tablet  Take 400-800 mg by mouth every 6 (six) hours as needed for moderate pain.    Marland Kitchen. oxyCODONE (ROXICODONE) 5 MG immediate release tablet Take 1-2 tablets (5-10 mg total) by mouth every 4 (four) hours as needed for moderate pain or severe pain. (Patient not taking: Reported on 12/23/2015) 15 tablet 0  . zolpidem (AMBIEN) 10 MG tablet Take 10 mg by mouth at bedtime as needed for sleep.      OB/GYN Status:  No LMP for male patient.  General Assessment Data [facility] TTS Assessment: In system Is this a Tele or Face-to-Face Assessment?: Face-to-Face Is this an Initial Assessment or a Re-assessment for this encounter?: Initial Assessment Marital status: Single Is patient pregnant?: No Pregnancy Status: No Living Arrangements: Alone Can pt return to current living arrangement?: Yes Admission Status: Voluntary Is patient capable of signing voluntary admission?: Yes Referral Source: Self/Family/Friend     Crisis Care Plan Living Arrangements: Alone Name of Psychiatrist: none Name of Therapist: none  Education Status Is patient currently in school?: No  Risk to self with the past 6 months Suicidal Ideation: No Has patient been a risk to self within the past 6 months prior to admission? : No Suicidal Intent: No Has patient had any suicidal intent within the past 6 months prior to admission? : No Is patient at risk for suicide?: No Suicidal Plan?: No Has patient had any suicidal plan within the past 6 months prior to admission? : No Access to Means: No What has been your use of drugs/alcohol within the last 12 months?: see above Previous Attempts/Gestures: No Intentional Self Injurious Behavior: None Family Suicide History: Unknown Persecutory voices/beliefs?: No Depression: No Depression Symptoms: Feeling angry/irritable Substance abuse history and/or treatment for substance abuse?: Yes Suicide prevention information given to non-admitted patients:  Not applicable  Risk to Others within the past 6 months Homicidal Ideation: No-Not Currently/Within Last 6 Months Does patient have any lifetime risk of violence toward others beyond the six months prior to admission? : Unknown Thoughts of Harm to Others: No-Not Currently Present/Within Last 6 Months Current Homicidal Intent: No Current Homicidal Plan: No Access to Homicidal Means: No History of harm to others?: No Assessment of Violence: None Noted Does patient have access to weapons?: No Is patient on probation?: Unknown  Psychosis Hallucinations: None noted Delusions: None noted  Mental Status Report Appearance/Hygiene: Disheveled Eye Contact: Unable to Assess Motor Activity: Unable to assess Speech: Pressured Level of Consciousness: Unable to assess Mood: Irritable Affect: Anxious Anxiety Level: Minimal Thought Processes: Coherent, Relevant Judgement: Partial Orientation: Person, Place, Time, Situation Obsessive Compulsive Thoughts/Behaviors: Unable to Assess  Cognitive Functioning Concentration: Unable to Assess Memory: Unable to Assess IQ: Average Insight: see judgement above Impulse Control: Unable to Assess Appetite: Good Sleep: No Change Vegetative Symptoms: None  ADLScreening [facility] Assessment Services) Patient's cognitive ability adequate to safely complete daily activities?: Yes Patient able to express need for assistance with ADLs?: Yes Independently performs ADLs?: Yes (appropriate for developmental age)  Prior Inpatient Therapy Prior Inpatient Therapy: No  Prior Outpatient Therapy Prior Outpatient Therapy: Yes Prior Therapy Dates: unknown Prior Therapy Facilty/Provider(s): unknown Reason for Treatment: depression Does patient have an ACCT team?: No Does patient have Intensive In-House Services?  :  No Does patient have [facility] services? : No Does patient have P4CC services?: No  ADL Screening (condition at time of admission) Patient's cognitive  ability adequate to safely complete daily activities?: Yes Is the patient deaf or have difficulty hearing?: No Does the patient have difficulty seeing, even when wearing glasses/contacts?: No Does the patient have difficulty concentrating, remembering, or making decisions?: No Patient able to express need for assistance with ADLs?: Yes Does the patient have difficulty dressing or bathing?: No Independently performs ADLs?: Yes (appropriate for developmental age) Does the patient have difficulty walking or climbing stairs?: No Weakness of Legs: None Weakness of Arms/Hands: None  Home Assistive Devices/Equipment Home Assistive Devices/Equipment: None  Therapy Consults (therapy consults require a physician order) PT Evaluation Needed: No OT Evalulation Needed: No SLP Evaluation Needed: No Abuse/Neglect Assessment (Assessment to be complete while patient is alone) Physical Abuse: Denies Verbal Abuse: Denies Sexual Abuse: Denies Exploitation of patient/patient's resources: Denies Self-Neglect: Denies Values / Beliefs Cultural Requests During Hospitalization: None Spiritual Requests During Hospitalization: None Consults Spiritual Care Consult Needed: No Social Work Consult Needed: No Merchant navy officerAdvance Directives (For Healthcare) Does patient have an advance directive?: No Would patient like information on creating an advanced directive?: No - patient declined information Nutrition Screen- [facility] Adult/WL/AP Patient's home diet: Regular Has the patient recently lost weight without trying?: No Has the patient been eating poorly because of a decreased appetite?: No Malnutrition Screening Tool Score: 0  Additional Information 1:1 In Past 12 Months?: No CIRT Risk: No Elopement Risk: No Does patient have medical clearance?: Yes     Disposition:  Disposition Initial Assessment Completed for this Encounter: Yes Disposition of Patient: Inpatient treatment program (per Shuvon Rankin, NP) Type of  inpatient treatment program: Adult ([facility]  Laddie AquasSamantha M Kelvin Sennett 12/24/2015 8:07 AM

## 2015-12-24 NOTE — Plan of Care (Signed)
Problem: Activity: Goal: Will identify at least one activity in which they can participate Outcome: Not Progressing Patient is isolative to room.  Has not attended any groups.

## 2015-12-24 NOTE — Progress Notes (Signed)
Adult Psychoeducational Group Note  Date:  12/24/2015 Time:  9:26 PM  Group Topic/Focus:  Wrap-Up Group:   The focus of this group is to help patients review their daily goal of treatment and discuss progress on daily workbooks.  Participation Level:  Did Not Attend  Participation Quality:  Did not attend  Affect:  Did not attend  Cognitive:  Did not attend  Insight: None  Engagement in Group:  Did not attend  Modes of Intervention:  Did not attend  Additional Comments:  Patient did not attend wrap up group tonight.   Denis Carreon L Chonda Baney 12/24/2015, 9:26 PM

## 2015-12-24 NOTE — [facility] Group Notes (Signed)
[facility] LCSW Group Therapy  12/24/2015 2:38 PM  Type of Therapy:  Group Therapy  Participation Level:  Active  Participation Quality:  Attentive  Affect:  Appropriate  Cognitive:  Alert and Oriented  Insight:  Improving  Engagement in Therapy:  Engaged  Modes of Intervention:  Confrontation, Discussion, Education, Exploration, Problem-solving, Rapport Building, Socialization and Support  Summary of Progress/Problems: Today's Topic: Overcoming Obstacles. Patients identified one short term goal and potential obstacles in reaching this goal. Patients processed barriers involved in overcoming these obstacles. Patients identified steps necessary for overcoming these obstacles and explored motivation (internal and external) for facing these difficulties head on. Ian MalkinZach was attentive and engaged during today's processing group. He shared that he is "not big on groups" but continued to share his admission story and stated that his biggest obstacle is "to cut back or stop the drinking." He states that his drinking has gradually gotten out of control over the past three months. "It got really bad Saturday. I quit my job but hopefull I can get it back." Ian MalkinZach shared that he is hoping to detox and return to life "as normal." He seems to minimize the impact that drinking has had on his life but is open to learning more about outpatient options (SAIOP, med management/counseling/AA).   Smart, Keyauna Graefe LCSW 12/24/2015, 2:38 PM

## 2015-12-24 NOTE — Tx Team (Signed)
Interdisciplinary Treatment Plan Update (Adult) Date: 12/24/2015    Time Reviewed: 9:30 AM  Progress in Treatment: Attending groups: Continuing to assess, patient new to milieu Participating in groups: Continuing to assess, patient new to milieu Taking medication as prescribed: Yes Tolerating medication: Yes Family/Significant other contact made: No, CSW assessing for appropriate contacts Patient understands diagnosis: Yes Discussing patient identified problems/goals with staff: Yes Medical problems stabilized or resolved: Yes Denies suicidal/homicidal ideation: Yes Issues/concerns per patient self-inventory: Yes Other:  New problem(s) identified: N/A  Discharge Plan or Barriers: CSW continuing to assess, patient new to milieu.  Reason for Continuation of Hospitalization:  Depression Anxiety Medication Stabilization   Comments: N/A  Estimated length of stay: 3-5 days    Patient is a 30 y.o. admitted for increasing anger and substance abuse.  Patient will benefit from crisis stabilization, medication evaluation, group therapy and psycho education in addition to case management for discharge planning. At discharge, it is recommended that Pt remain compliant with established discharge plan and continued treatment.   Review of initial/current patient goals per problem list:  1. Goal(s): Patient will participate in aftercare plan   Met: No   Target date: 3-5 days post admission date   As evidenced by: Patient will participate within aftercare plan AEB aftercare provider and housing plan at discharge being identified. 6/26: Goal not met: CSW assessing for appropriate referrals for pt and will have follow up secured prior to d/c.   2. Goal (s): Patient will exhibit decreased depressive symptoms and suicidal ideations.   Met: No   Target date: 3-5 days post admission date   As evidenced by: Patient will utilize self rating of depression at 3 or below  and demonstrate decreased signs of depression or be deemed stable for discharge by MD. 6/26: Goal not met: Pt presents with flat affect and depressed mood. Pt admitted with depression rating of 10. Pt to show decreased sign of depression and a rating of 3 or less before d/c.    3. Goal(s): Patient will demonstrate decreased signs and symptoms of anxiety.   Met: No   Target date: 3-5 days post admission date   As evidenced by: Patient will utilize self rating of anxiety at 3 or below and demonstrated decreased signs of anxiety, or be deemed stable for discharge by MD 6/26: Goal not met: Pt presents with anxious mood and affect. Pt admitted with anxiety rating of 10. Pt to show decreased sign of anxiety and a rating of 3 or less before d/c.    4. Goal(s): Patient will demonstrate decreased signs of withdrawal due to substance abuse   Met: Goal progressing   Target date: 3-5 days post admission date   As evidenced by: Patient will produce a CIWA/COWS score of 0, have stable vitals signs, and no symptoms of withdrawal 6/26: CIWA of 1, experiencing anxiety.   Attendees: Patient:    Family:    Physician:  Dr. Eappen 12/24/2015 9:30 AM  Nursing: Sara Twyman, Caroline Beaudry, Jane Ochieng, Karen, Patrice White, RN 12/24/2015 9:30 AM  Clinical Social Worker: Kristin Drinkard, LCSW 12/24/2015 9:30 AM  Other: Lauren Carter, LCSWA; Heather Smart, LCSW  12/24/2015 9:30 AM  Other:  12/24/2015 9:30 AM  Other:  12/24/2015 9:30 AM  Other: Aggie Nwoko, Tankia Lewis, NP 12/24/2015 9:30 AM  Other:    Other:        Scribe for Treatment Team:  Kristin Drinkard, LCSW [phone]        

## 2015-12-24 NOTE — Progress Notes (Signed)
Recreation Therapy Notes  Date: 06.26.2017 Time: 9:30am Location: [address] Group Room   Group Topic: Stress Management  Goal Area(s) Addresses:  Patient will actively participate in stress management techniques presented during session.   Behavioral Response: Did not attend.   Kayden Amend L Pasqualina Colasurdo, LRT/CTRS        Yuri Flener L 12/24/2015 10:18 AM 

## 2015-12-25 NOTE — Progress Notes (Signed)
Recreation Therapy Notes  Animal-Assisted Activity (AAA) Program Checklist/Progress Notes Patient Eligibility Criteria Checklist & Daily Group note for Rec Tx Intervention  Date: 06.27.2017 Time: 2:45pm [facility]: [address]    AAA/T Program Assumption of Risk Form signed by Patient/ or Parent Legal Guardian Yes  Patient is free of allergies or sever asthma Yes  Patient reports no fear of animals Yes  Patient reports no history of cruelty to animals Yes  Patient understands his/her participation is voluntary Yes  Patient washes hands before animal contact Yes  Patient washes hands after animal contact Yes  Behavioral Response: Did not attend.   Marykay Lexenise L Antoninette Lerner, LRT/CTRS        Floyed Masoud L 12/25/2015 3:02 PM
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
## 2015-12-25 NOTE — [facility] Group Notes (Signed)
[facility] LCSW Group Therapy  12/25/2015 1:19 PM  Type of Therapy:  Group Therapy  Participation Level:  Active  Participation Quality:  Attentive  Affect:  Appropriate  Cognitive:  Alert and Oriented  Insight:  Engaged  Engagement in Therapy:  Improving  Modes of Intervention:  Discussion, Education, Exploration, Problem-solving, Rapport Building, Socialization and Support  Summary of Progress/Problems: MHA Speaker came to talk about his personal journey with substance abuse and addiction. The pt processed ways by which to relate to the speaker. MHA speaker provided handouts and educational information pertaining to groups and services offered by the [facility].   Smart, Jerry Yazzie LCSW 12/25/2015, 1:19 PM

## 2015-12-25 NOTE — Progress Notes (Signed)
D: Pt presents with flat affect. Pt denies depression. Pt rates anxiety 5/10. Pt reports fair sleep and appetite. Pt verbalized that his mood is stabilized and that he doesn't feel angry or have any aggression towards anyone. Pt denies suicidal thoughts and verbally contracts for safety. Pt verbalized concerns of high blood pressure. Writer spoke with pt in regards to high blood pressure. Pt denies hx of hypertension. Pt stated that his blood pressure is high because he's anxious about being here. Pt asymptomatic.  A: Medications reviewed by Clinical research associatewriter. Verbal support provided. Pt encouraged to attend groups. 15 minute checks performed for safety.  R: Pt receptive to tx.

## 2015-12-25 NOTE — [facility] Suicide Risk Assessment (Signed)
[facility] INPATIENT:  Family/Significant Other Suicide Prevention Education  Suicide Prevention Education:  Education Completed; Jerry Gray (pt's mother) [phone] has been identified by the patient as the family member/significant other with whom the patient will be residing, and identified as the person(s) who will aid the patient in the event of a mental health crisis (suicidal ideations/suicide attempt).  With written consent from the patient, the family member/significant other has been provided the following suicide prevention education, prior to the and/or following the discharge of the patient.  The suicide prevention education provided includes the following:  Suicide risk factors  Suicide prevention and interventions  National Suicide Hotline telephone number  [facility] assessment telephone number  [facility] Assistance 911  [facility] and/or Residential Mobile Crisis Unit telephone number  Request made of family/significant other to:  Remove weapons (e.g., guns, rifles, knives), all items previously/currently identified as safety concern.    Remove drugs/medications (over-the-counter, prescriptions, illicit drugs), all items previously/currently identified as a safety concern.  The family member/significant other verbalizes understanding of the suicide prevention education information provided.  The family member/significant other agrees to remove the items of safety concern listed above.  Smart, Toyoko Silos LCSW 12/25/2015, 11:15 AM

## 2015-12-25 NOTE — Progress Notes (Signed)
Jerry Gray rates Anxiety 3/10 and Depression 0/10. His goal today was to "try to not get my blood pressure up". She rates HA pain 6/10. Denies SI/HI/AVH. Encouragement and support given. Medications administered as prescribed. Continue Q 15 minute checks for patient safety and medication effectiveness.

## 2015-12-25 NOTE — Progress Notes (Signed)
D: Pt was in bed in his room upon initial approach.  He presents with depressed affect and mood.  Pt reports his day was "not bad."  He reports he "hasn't done a whole lot, rested, I went to one meeting."  He reports his withdrawal symptoms are decreasing.  Reports back pain of 5/10.  Denies SI/HI, denies hallucinations.  Pt did not attend evening group.   A: Introduced self to pt.  Actively listened to pt and offered support and encouragement.  Medication administered per order.  PRN medication administered for pain.    R: Pt is compliant with medications.  He verbally contracts for safety.  Will continue to monitor and assess.

## 2015-12-25 NOTE — Progress Notes (Signed)
Pt attended AA group this evening.  

## 2015-12-25 NOTE — Progress Notes (Signed)
[facility] MD Progress Note  12/25/2015 3:07 PM Jerry Gray  MRN:  [id number] Subjective:  " Im fine, been here a couple days but every day has been better. The shakes have stopped, the vomiting has stopped. I got into an argument with my boss, got fed up and quit, I hit the bottle pretty hard. So I called mom and made the decision to get help.   Objective:  On evaluation the patient reported: Patient states that he feels good much better.  States that he is eating without difficulty, although he is having trouble sleeping which he reports prior to admissions. Tolerating medications without adverse reactions.  Reports that he continues to attend/participate in group which is helping him learn to communicate better. 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  His goal today is to get his blood pressure down. " I talked to my boss yesterday and he is going to let me come back to group. Most of the time talking to therapists doesn't work for me, I have tried it in the past. He reports needing a break and some time off.   At this time patient denies suicidal/self harming thoughts an psychosis.    Principal Problem: Alcohol dependence with alcohol-induced mood disorder (HCC) Diagnosis:   Patient Active Problem List   Diagnosis Date Noted  . Alcohol abuse with intoxication (HCC) [F10.129] 12/24/2015  . Alcohol withdrawal (HCC) [F10.239] 12/24/2015  . Polysubstance dependence including opioid type drug, episodic abuse (HCC) [F11.20, F19.20] 12/23/2015  . Alcohol dependence with alcohol-induced mood disorder (HCC) [F10.24] 12/23/2015  . MDD (major depressive disorder), recurrent severe, without psychosis (HCC) [F33.2] 12/23/2015  . Aggression [F60.89]   . Alcohol abuse [F10.10] 08/24/2015  . Elevated LFTs [R79.89] 08/24/2015  . Mass of forearm [R22.30] 08/24/2015   Total Time spent with patient: 20 minutes  Past Psychiatric History: Alcohol use  Past Medical History:  Past Medical History   Diagnosis Date  . Arthritis   . Substance abuse   . Seizures (HCC)   . Neuromuscular disorder (HCC)   . ADHD (attention deficit hyperactivity disorder)   . OCD (obsessive compulsive disorder)   . Depression   . Suicidal thoughts    History reviewed. No pertinent past surgical history. Family History: History reviewed. No pertinent family history. Family Psychiatric  History: See HPI Social History:  History  Alcohol Use  . 96.6 oz/week  . 21 Cans of beer, 140 Shots of liquor, 0 Standard drinks or equivalent per week    Comment: pt states a fifth a day     History  Drug Use  . 5.00 per week  . Special: Marijuana, Cocaine    Social History   Social History  . Marital Status: Single    Spouse Name: N/A  . Number of Children: N/A  . Years of Education: N/A   Social History Main Topics  . Smoking status: Current Every Day Smoker -- 1.50 packs/day for 10 years    Types: Cigarettes  . Smokeless tobacco: None  . Alcohol Use: 96.6 oz/week    21 Cans of beer, 140 Shots of liquor, 0 Standard drinks or equivalent per week     Comment: pt states a fifth a day  . Drug Use: 5.00 per week    Special: Marijuana, Cocaine  . Sexual Activity: No   Other Topics Concern  . None   Social History Narrative   Additional Social History:    Pain Medications: see PTA meds Prescriptions: see PTA  meds Over the Counter: see PTA meds History of alcohol / drug use?: Yes Negative Consequences of Use: Legal, Personal relationships, Financial Withdrawal Symptoms: Agitation, Aggressive/Assaultive Name of Substance 1: Alcohol 1 - Amount (size/oz): 1/5 of whiskey or more 1 - Frequency: daily 1 - Duration: for at least a year 1 - Last Use / Amount: 12/22/15 Name of Substance 2: Cocaine 2 - Amount (size/oz): pt reports using an 8 ball/day but tested negative for cocaine Name of Substance 3: THC 3 - Amount (size/oz): 3.5 grams 3 - Frequency: daily 3 - Duration: ongoing     Sleep:  Poor  Appetite:  Good  Current Medications: Current Facility-Administered Medications  Medication Dose Route Frequency Provider Last Rate Last Dose  . hydrOXYzine (ATARAX/VISTARIL) tablet 25 mg  25 mg Oral Q6H PRN Rockey SituFernando A Cobos, MD      . ibuprofen (ADVIL,MOTRIN) tablet 800 mg  800 mg Oral TID PRN Kerry HoughSpencer E Simon, PA-C   800 mg at 12/25/15 0809  . loperamide (IMODIUM) capsule 2-4 mg  2-4 mg Oral PRN Craige CottaFernando A Cobos, MD      . LORazepam (ATIVAN) tablet 1 mg  1 mg Oral TID Craige CottaFernando A Cobos, MD   1 mg at 12/25/15 1208   Followed by  . [START ON 12/26/2015] LORazepam (ATIVAN) tablet 1 mg  1 mg Oral BID Craige CottaFernando A Cobos, MD       Followed by  . [START ON 12/27/2015] LORazepam (ATIVAN) tablet 1 mg  1 mg Oral Daily Fernando A Cobos, MD      . magnesium hydroxide (MILK OF MAGNESIA) suspension 30 mL  30 mL Oral Daily PRN Shuvon B Rankin, NP      . multivitamin with minerals tablet 1 tablet  1 tablet Oral Daily Craige CottaFernando A Cobos, MD   1 tablet at 12/25/15 0807  . nicotine (NICODERM CQ - dosed in mg/24 hours) patch 21 mg  21 mg Transdermal Daily Shuvon B Rankin, NP   21 mg at 12/25/15 0807  . ondansetron (ZOFRAN) tablet 4 mg  4 mg Oral Q8H PRN Shuvon B Rankin, NP      . ondansetron (ZOFRAN-ODT) disintegrating tablet 4 mg  4 mg Oral Q6H PRN Rockey SituFernando A Cobos, MD      . thiamine (VITAMIN B-1) tablet 100 mg  100 mg Oral Daily Shuvon B Rankin, NP   100 mg at 12/25/15 0807  . traZODone (DESYREL) tablet 50 mg  50 mg Oral QHS PRN Kristeen MansFran E Hobson, NP        Lab Results:  Results for orders placed or performed during the hospital encounter of 12/23/15 (from the past 48 hour(s))  Ethanol     Status: Abnormal   Collection Time: 12/23/15  4:05 PM  Result Value Ref Range   Alcohol, Ethyl (B) 40 (H) <5 mg/dL    Comment:        LOWEST DETECTABLE LIMIT FOR SERUM ALCOHOL IS 5 mg/dL FOR MEDICAL PURPOSES ONLY     Blood Alcohol level:  Lab Results  Component Value Date   ETH 40* 12/23/2015   ETH 407*  12/23/2015    Metabolic Disorder Labs: No results found for: HGBA1C, MPG No results found for: PROLACTIN No results found for: CHOL, TRIG, HDL, CHOLHDL, VLDL, LDLCALC  Physical Findings: AIMS: Facial and Oral Movements Muscles of Facial Expression: None, normal Lips and Perioral Area: None, normal Jaw: None, normal Tongue: None, normal,Extremity Movements Upper (arms, wrists, hands, fingers): None, normal Lower (legs, knees, ankles, toes):  None, normal, Trunk Movements Neck, shoulders, hips: None, normal, Overall Severity Severity of abnormal movements (highest score from questions above): None, normal Incapacitation due to abnormal movements: None, normal Patient's awareness of abnormal movements (rate only patient's report): No Awareness, Dental Status Current problems with teeth and/or dentures?: No Does patient usually wear dentures?: No  CIWA:  CIWA-Ar Total: 0 COWS:  COWS Total Score: 4  Musculoskeletal: Strength & Muscle Tone: within normal limits Gait & Station: normal Patient leans: N/A  Psychiatric Specialty Exam: Physical Exam  Review of Systems  Neurological:       Nystagmus    Blood pressure 141/89, pulse 106, temperature 98 F (36.7 C), temperature source Oral, resp. rate 16, height 5\' 11"  (1.803 m), weight 78.019 kg (172 lb), SpO2 100 %.Body mass index is 24 kg/(m^2).  General Appearance: Fairly Groomed, paper scrubs  Eye Contact:  Fair  Speech:  Clear and Coherent and Normal Rate  Volume:  Normal  Mood:  Euthymic  Affect:  Appropriate and Congruent  Thought Process:  Goal Directed and Linear  Orientation:  Full (Time, Place, and Person)  Thought Content:  WDL and Logical  Suicidal Thoughts:  No  Homicidal Thoughts:  No  Memory:  Immediate;   Fair Recent;   Fair  Judgement:  Intact  Insight:  Fair  Psychomotor Activity:  Normal  Concentration:  Concentration: Fair  Recall:  Good  Fund of Knowledge:  Fair  Language:  Fair  Akathisia:  No   Handed:  Right  AIMS (if indicated):     Assets:  Communication Skills Desire for Improvement Financial Resources/Insurance Housing Leisure Time Physical Health Resilience Social Support Talents/Skills Vocational/Educational  ADL's:  Intact  Cognition:  WNL  Sleep:  Number of Hours: 6.75     Treatment Plan Summary: Daily contact with patient to assess and evaluate symptoms and progress in treatment and Medication management   Depression/insomnia: Continue the Trazodone 50 mg  Alcohol use: Continue CIQA protocol, continue Lorazepam 1 mg Q 6 prn for severe anxiety/agitation, Hydroxyzine 25 mg qid for mild anxiety. Continue to work with mindfulness, CBT help identify the cognitive distortions that keep the depression going.  Discuss other life style changes that can help with both his depression and his alcohol use such as exercise, meditation  Back pain- Will continue ibuprofen 800mg  po TID prn for back pain.  -Smoking cessation- Will continue with Nicotine 21mg  transdermal.  -Nausea: Zofran ODT 4mg  po q8h prn nausea  Truman Haywardakia S Starkes, FNP 12/25/2015, 3:07 PM

## 2015-12-26 ENCOUNTER — Encounter (HOSPITAL_COMMUNITY): Payer: Self-pay | Admitting: Psychiatry

## 2015-12-26 DIAGNOSIS — H5501 Congenital nystagmus: Secondary | ICD-10-CM | POA: Insufficient documentation

## 2015-12-26 DIAGNOSIS — F1020 Alcohol dependence, uncomplicated: Secondary | ICD-10-CM | POA: Diagnosis present

## 2015-12-26 DIAGNOSIS — F10239 Alcohol dependence with withdrawal, unspecified: Secondary | ICD-10-CM

## 2015-12-26 DIAGNOSIS — F1024 Alcohol dependence with alcohol-induced mood disorder: Secondary | ICD-10-CM

## 2015-12-26 MED ORDER — NICOTINE 21 MG/24HR TD PT24: 21.0000 mg | MEDICATED_PATCH | Freq: Every day | TRANSDERMAL | Status: AC

## 2015-12-26 MED ORDER — TRAZODONE HCL 50 MG PO TABS: 50.0000 mg | ORAL_TABLET | Freq: Every evening | ORAL | Status: AC | PRN

## 2015-12-26 NOTE — Progress Notes (Signed)
Recreation Therapy Notes  Date: 06.28.2017 Time: 9:30am Location: [address] Group Room   Group Topic: Stress Management  Goal Area(s) Addresses:  Patient will actively participate in stress management techniques presented during session.   Behavioral Response: Did not attend.   Nyliah Nierenberg L Desiree Daise, LRT/CTRS        Ezmeralda Stefanick L 12/26/2015 10:22 AM 

## 2015-12-26 NOTE — Progress Notes (Signed)
D: Patient finishes ativan protocol today.  He is focused on discharge.  Patient will discharge this afternoon with a bus pass for transportation.  He denies SI/HI/AVH.  He requested ibuprofen for back pain and it was effective.  He is observed in day room interacting with his peers. A: Continue to monitor medication management and MD orders.  Safety checks completed every 15 minutes per protocol. Offer support and encouragement as needed. R: Patient is receptive to staff; his behavior is appropriate.

## 2015-12-26 NOTE — Progress Notes (Signed)
Discharge note:  Patient received all belongings from unit and locker.  Reviewed AVS/transition report and medications. Patient received prescriptions and medications.  He understood all follow up appointments.  He denies SI/HI/AVH.  Patient left ambulatory for the bus station.

## 2015-12-26 NOTE — Tx Team (Signed)
Interdisciplinary Treatment Plan Update (Adult) Date: 12/24/2015    Time Reviewed: 9:30 AM  Progress in Treatment: Attending groups: Yes Participating in groups: Minimally, when he attends  Taking medication as prescribed: Yes Tolerating medication: Yes Family/Significant other contact made: SPE completed with pt's mother. Collateral information also provided.  Patient understands diagnosis: Yes Discussing patient identified problems/goals with staff: Yes Medical problems stabilized or resolved: Yes Denies suicidal/homicidal ideation: Yes Issues/concerns per patient self-inventory: Yes Other:  New problem(s) identified: N/A  Discharge Plan or Barriers: Pt wanted follow-up scheduled with his PCP and possible appt at [facility] and wellness if any available. Pt agreeable to signing consent for [place] but demonstrated minimal interest in mental health services (cousneling or med management). Pt also provided with NA/AA information and Mental Health Association information/pamphlets.   Reason for Continuation of Hospitalization:  none  Comments: N/A  Estimated length of stay: d/c today  Patient is a 30 y.o. admitted for increasing anger and substance abuse.  Patient will benefit from crisis stabilization, medication evaluation, group therapy and psycho education in addition to case management for discharge planning. At discharge, it is recommended that Pt remain compliant with established discharge plan and continued treatment.   Review of initial/current patient goals per problem list:  1. Goal(s): Patient will participate in aftercare plan   Met: Yes   Target date: 3-5 days post admission date   As evidenced by: Patient will participate within aftercare plan AEB aftercare provider and housing plan at discharge being identified. 6/26: Goal not met: CSW assessing for appropriate referrals for pt and will have follow up secured prior to  d/c. 6/28: Goal met. Pt to return home; follow-up with o/p provider.    2. Goal (s): Patient will exhibit decreased depressive symptoms and suicidal ideations.   Met: Yes   Target date: 3-5 days post admission date   As evidenced by: Patient will utilize self rating of depression at 3 or below and demonstrate decreased signs of depression or be deemed stable for discharge by MD. 6/26: Goal not met: Pt presents with flat affect and depressed mood. Pt admitted with depression rating of 10. Pt to show decreased sign of depression and a rating of 3 or less before d/c.  6/28: Goal met. Pt rates depression as 1/10 and presents with pleasant mood/calm affect. He denies SI/HI/AVH.   3. Goal(s): Patient will demonstrate decreased signs and symptoms of anxiety.   Met: Yes   Target date: 3-5 days post admission date   As evidenced by: Patient will utilize self rating of anxiety at 3 or below and demonstrated decreased signs of anxiety, or be deemed stable for discharge by MD 6/26: Goal not met: Pt presents with anxious mood and affect. Pt admitted with anxiety rating of 10. Pt to show decreased sign of anxiety and a rating of 3 or less before d/c. 6/28: Goal met. Pt rates anxiety as 3/10 and presents with pleasant mood/calm affect.     4. Goal(s): Patient will demonstrate decreased signs of withdrawal due to substance abuse   Met: Yes   Target date: 3-5 days post admission date   As evidenced by: Patient will produce a CIWA/COWS score of 0, have stable vitals signs, and no symptoms of withdrawal 6/26: CIWA of 1, experiencing anxiety. 6/28: Pt reports no withdrawals with CIWA of 0 and high BP. Per MD, pt is medically stable for discharge today.    Attendees: Patient:    Family:  Physician:  Dr. Shea Evans MD 12/26/2015 10:22 AM   Nursing:  Mayra Neer, Jonni Sanger Aud[place]on, RN 12/26/2015 10:22 AM   Clinical  Social Worker: Tilden Fossa, LCSW 12/26/2015 10:22 AM   Other: Peri Maris, LCSWA; [place]st, LCSW  12/26/2015 10:22 AM   Other:    Other:    Other: Agustina Caroli,  12/26/2015 10:22 AM   Other:    Other:        Scribe for Treatment Team:  Maxie Better, MSW, LCSW Clinical Social Worker 12/26/2015 10:22 AM

## 2015-12-26 NOTE — [facility] Suicide Risk Assessment (Signed)
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 Alcohol use disorder, severe, dependence (HCC) [F10.20] 12/26/2015  . 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 Polysubstance dependence including opioid type drug, episodic abuse (HCC) [F11.20, F19.20] 12/23/2015  . Aggression [F60.89]   . Alcohol abuse [F10.10] 08/24/2015  . Elevated LFTs [R79.89] 08/24/2015  . Mass of forearm [R22.30] 08/24/2015    Total Time spent with patient: 30 minutes  Musculoskeletal: Strength & Muscle Tone: within normal limits Gait & Station: normal Patient leans: N/A  Psychiatric Specialty Exam: Review of Systems  Psychiatric/Behavioral: Positive for substance abuse. Negative for depression.  All other systems reviewed and are negative.   Blood pressure 134/102, pulse 102, temperature 97.9 F (36.6 C), temperature source Oral, resp. rate 16, height 5\' 11"  (1.803 m), weight 78.019 kg (172 lb), SpO2 100 %.Body mass index is 24 kg/(m^2).  General Appearance: Casual  Eye Contact::  Fair  Speech:  Clear and Coherent409  Volume:  Normal  Mood:  Euthymic  Affect:  Congruent  Thought Process:  Goal Directed and Descriptions of Associations: Intact  Orientation:  Full (Time, Place, and Person)  Thought Content:  Logical  Suicidal Thoughts:  No  Homicidal Thoughts:  No  Memory:  Immediate;   Fair Recent;   Fair Remote;   Fair  Judgement:  Fair  Insight:  Fair  Psychomotor Activity:  Normal  Concentration:  Fair  Recall:  FiservFair  Fund of Knowledge:Fair  Language: Fair  Akathisia:  No  Handed:  Right  AIMS (if indicated):     Assets:  Desire for  Improvement  Sleep:  Number of Hours: 6.75  Cognition: WNL  ADL's:  Intact   Mental Status Per Nursing Assessment::   On Admission:     Demographic Factors:  Male and Caucasian  Loss Factors: NA  Historical Factors: Impulsivity  Risk Reduction Factors:   Positive social support  Continued Clinical Symptoms:  Alcohol/Substance Abuse/Dependencies  Cognitive Features That Contribute To Risk:  None    Suicide Risk:  Minimal: No identifiable suicidal ideation.  Patients presenting with no risk factors but with morbid ruminations; may be classified as minimal risk based on the severity of the depressive symptoms  Follow-up Information    [facility]   Why:  No openings currently. Receptionist asked that you call on Wed. 01/02/16 at 9:00AM to check schedule.    Contact information:   [address] [place], [place] [address] Phone: [phone] Fax: [phone]      [facility].   Why:  Walk in between 8am-12pm for hospital follow-up/assessment for counseling/substance abuse services if interested.    Contact information:   [address] [place], [place] [address] Phone: [phone] Fax: [phone]      [facility] On 01/02/2016.   Why:  Appt on this date for hospital follow-up/medication management at 10:00AM with Chauncey Fischerodd Barton PA.    Contact information:   ATTN: Dr. Verlon SettingJacokes [address] [place], [place] [address] Phone: [phone] Fax: [phone]      Plan Of Care/Follow-up recommendations:  Activity:  no restrictions Diet:  regular Tests:  as needed Other:  Patient needs to be sober for a period of time before he can be diagnosed with a primary bipolar disorder.  Brent Noto, MD 12/26/2015, 10:00 AM

## 2015-12-26 NOTE — Discharge Summary (Signed)
Physician Discharge Summary Note  Patient:  Jerry Gray is an 30 y.o., male MRN:  [id number] DOB:  1985-11-30 Patient phone:  [phone] (home)  Patient address:   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 Alcohol use disorder, severe, dependence (HCC) [F10.20] 12/26/2015  . 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 Polysubstance dependence including opioid type drug, episodic abuse (HCC) [F11.20, F19.20] 12/23/2015  . Aggression [F60.89]   . Alcohol abuse [F10.10] 08/24/2015  . Elevated LFTs [R79.89] 08/24/2015  . Mass of forearm [R22.30] 08/24/2015    Past Psychiatric History:  See HPI  Past Medical History:  Past Medical History  Diagnosis Date  . Arthritis   . Substance abuse   . Seizures (HCC)   . Neuromuscular disorder (HCC)   . ADHD (attention deficit hyperactivity disorder)   . OCD (obsessive compulsive disorder)   . Depression   . Suicidal thoughts    History reviewed. No pertinent past surgical history. Family History: History reviewed. No pertinent family history. Family Psychiatric  History:  See HPI Social History:  History  Alcohol Use  . 96.6 oz/week  . 21 Cans of beer, 140 Shots of liquor, 0 Standard drinks or equivalent per week    Comment: pt states a fifth a day     History  Drug Use  . 5.00 per week  . Special: Marijuana, Cocaine    Social History   Social History  . Marital Status: Single    Spouse Name: N/A  . Number of  Children: N/A  . Years of Education: N/A   Social History Main Topics  . Smoking status: Current Every Day Smoker -- 1.50 packs/day for 10 years    Types: Cigarettes  . Smokeless tobacco: None  . Alcohol Use: 96.6 oz/week    21 Cans of beer, 140 Shots of liquor, 0 Standard drinks or equivalent per week     Comment: pt states a fifth a day  . Drug Use: 5.00 per week    Special: Marijuana, Cocaine  . Sexual Activity: No   Other Topics Concern  . 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  He was treated with meds and their indications listed below.  Medical problems were identified and treated as needed.  Home medications were restarted as appropriate.  Improvement was monitored by observation and Jerry Gray daily report of symptom reduction.  Emotional and mental status was monitored by daily self inventory reports completed by Jerry Gray and clinical staff.  Patient reported continued improvement, denied any new concerns.  Patient had been compliant on medications and denied side effects.  Support and encouragement was provided.    At time of discharge, patient rated both depression and anxiety levels to be manageable and minimal.  Patient encouraged to attend groups to help with recognizing triggers of emotional crises and de-stabilizations.  Patient encouraged to attend group to  help identify the positive things in life that would help in dealing with feelings of loss, depression and unhealthy or abusive tendencies.         Jerry Gray was evaluated by the treatment team for stability and plans for continued recovery upon discharge.  He was offered further treatment options upon discharge including Residential, Intensive Outpatient and Outpatient treatment.  He will follow up with agencies listed below  for medication management and counseling.  Encouraged patient to maintain satisfactory support network and home environment.  Advised to adhere to medication compliance and outpatient treatment follow up.  Prescriptions provided.       Jerry Gray motivation was an integral factor for scheduling further treatment.  Employment, transportation, bed availability, health status, family support, and any pending legal issues were also considered during his hospital stay.  Upon completion of this admission the patient was both mentally and medically stable for discharge denying suicidal/homicidal ideation, auditory/visual/tactile hallucinations, delusional thoughts and paranoia.      Physical Findings: AIMS: Facial and Oral Movements Muscles of Facial Expression: None, normal Lips and Perioral Area: None, normal Jaw: None, normal Tongue: None, normal,Extremity Movements Upper (arms, wrists, hands, fingers): None, normal Lower (legs, knees, ankles, toes): None, normal, Trunk Movements Neck, shoulders, hips: None, normal, Overall Severity Severity of abnormal movements (highest score from questions above): None, normal Incapacitation due to abnormal movements: None, normal Patient's awareness of abnormal movements (rate only patient's report): No Awareness, Dental Status Current problems with teeth and/or dentures?: No Does patient usually wear dentures?: No  CIWA:  CIWA-Ar Total: 0 COWS:  COWS Total Score: 4  Musculoskeletal: Strength & Muscle Tone: within normal limits Gait & Station: normal Patient leans: N/A  Psychiatric Specialty Exam:  See MD SRA Physical Exam  ROS  Blood pressure 134/102, pulse 102, temperature 97.9 F (36.6 C), temperature source Oral, resp. rate 16, height 5\' 11"  (1.803 m), weight 78.019 kg (172 lb), SpO2 100 %.Body mass index is 24 kg/(m^2).   Have you used any form of tobacco in the last 30 days? (Cigarettes, Smokeless Tobacco, Cigars, and/or Pipes): Yes  Has this  patient used any form of tobacco in the last 30 days? (Cigarettes, Smokeless Tobacco, Cigars, and/or Pipes) Yes, Rx given  Blood Alcohol level:  Lab Results  Component Value Date   ETH 40* 12/23/2015   ETH 407* 12/23/2015    Metabolic Disorder Labs:  No results found for: HGBA1C, MPG No results found for: PROLACTIN No results found for: CHOL, TRIG, HDL, CHOLHDL, VLDL, LDLCALC  See Psychiatric Specialty Exam and Suicide Risk Assessment completed by Attending Physician prior to discharge.  Discharge destination:  Home  Is patient on multiple antipsychotic therapies at discharge:  No   Has Patient had three or more failed trials of antipsychotic monotherapy by history:  No  Recommended Plan for Multiple Antipsychotic Therapies: NA     Medication List    STOP taking these medications        chlorproMAZINE 50 MG tablet  Commonly known as:  THORAZINE     clonazePAM 1 MG tablet  Commonly known as:  KLONOPIN     fluticasone 50 MCG/ACT nasal spray  Commonly known as:  FLONASE     ibuprofen 200 MG tablet  Commonly known as:  ADVIL,MOTRIN     oxyCODONE 5 MG immediate release tablet  Commonly known as:  ROXICODONE     zolpidem 10 MG tablet  Commonly known as:  AMBIEN      TAKE  these medications      Indication   nicotine 21 mg/24hr patch  Commonly known as:  NICODERM CQ - dosed in mg/24 hours  Place 1 patch (21 mg total) onto the skin daily.   Indication:  Nicotine Addiction     traZODone 50 MG tablet  Commonly known as:  DESYREL  Take 1 tablet (50 mg total) by mouth at bedtime as needed for sleep.   Indication:  Trouble Sleeping           Follow-up Information    Follow up with [facility].   Why:  No openings currently. Receptionist asked that you call on Wed. 01/02/16 at 9:00AM to check schedule.    Contact information:   [address] [place], [place] [address] Phone: [phone] Fax: [phone]      [facility] with [facility] [place].   Why:  Walk in between 8am-12pm for hospital follow-up/assessment for counseling/substance abuse services if interested.    Contact information:   [address] [place] [address] Phone: [phone] Fax: [phone]      Follow up with [facility] On 01/02/2016.   Why:  Appt on this date for hospital follow-up/medication management at 10:00AM with Chauncey Fischer PA.    Contact information:   ATTN: Dr. Verlon Setting [address] [place], [place] [address] Phone: [phone] Fax: [phone]      Follow-up recommendations:  Activity:  as tol Diet:  as tol  Comments:  1.  Take all your medications as prescribed.   2.  Report any adverse side effects to outpatient provider. 3.  Patient instructed to not use alcohol or illegal drugs while on prescription medicines. 4.  In the event of worsening symptoms, instructed patient to call 911, the crisis hotline or go to nearest emergency room for evaluation of symptoms.  Signed: Lindwood Qua, NP [facility] 12/26/2015, 10:16 AM

## 2015-12-26 NOTE — Progress Notes (Signed)
  [facility] Adult Case Management Discharge Plan :  Will you be returning to the same living situation after discharge:  Yes,  home At discharge, do you have transportation home?: Yes,  bus pass provided Do you have the ability to pay for your medications: Yes,  mental health  Release of information consent forms completed and submitted to medical records by CSW.  Patient to Follow up at: Follow-up Information    [facility].   Why:  No openings currently. Receptionist asked that you call on Wed. 01/02/16 at 9:00AM to check schedule.    Contact information:   [address] [place], [place] [address] Phone: [phone] Fax: [phone]      [facility] with [facility].   Why:  Walk in between 8am-12pm for hospital follow-up/assessment for counseling/substance abuse services if interested.    Contact information:   [address] [place], [place] [address] Phone: [phone] Fax: [phone]      Follow [facility] with [facility] On 01/02/2016.   Why:  Appt on this date for hospital follow-up/medication management at 10:00AM with Chauncey Fischerodd Barton PA.    Contact information:   ATTN: Dr. Verlon SettingJacokes [address] [place], [place] [address] Phone: [phone] Fax: [phone]      Next level of care provider has access to [facility] Link:no  Safety Planning and Suicide Prevention discussed: Yes,  SPE completed with pt's mother. SPI pamphlet and Mobile Crisis information provided to pt and he was encouraged to share information with his support network.  Have you used any form of tobacco in the last 30 days? (Cigarettes, Smokeless Tobacco, Cigars, and/or Pipes): Yes  Has patient been referred to the Quitline?: Patient refused referral  Patient has been referred for addiction treatment: Yes  Smart, Kearstin Learn LCSW 12/26/2015, 10:17 AM

## 2015-12-30 NOTE — ED Notes (Signed)
Pt chart accessed after pt called to inquire about missing belongings (stainless steel pocket knife). Darl PikesSusan, charge RN to check with security and to call pt back.-----------------------------------------------------------------------Rhetta MuraAjsa Gearold Wainer RN 12/30/2015  @ [phone]

## 2017-07-22 ENCOUNTER — Emergency Department (HOSPITAL_COMMUNITY)
Admission: EM | Admit: 2017-07-22 | Discharge: 2017-07-23 | Disposition: A | Discharge status: Home / Self Care | Payer: Self-pay | Attending: Emergency Medicine | Admitting: Emergency Medicine

## 2017-07-22 ENCOUNTER — Encounter (HOSPITAL_COMMUNITY): Payer: Self-pay

## 2017-07-22 ENCOUNTER — Other Ambulatory Visit: Payer: Self-pay

## 2017-07-22 DIAGNOSIS — K0889 Other specified disorders of teeth and supporting structures: Secondary | ICD-10-CM | POA: Insufficient documentation

## 2017-07-22 DIAGNOSIS — K029 Dental caries, unspecified: Secondary | ICD-10-CM | POA: Insufficient documentation

## 2017-07-22 MED ORDER — CLINDAMYCIN HCL 300 MG PO CAPS
300.0000 mg | ORAL_CAPSULE | Freq: Once | ORAL | Status: AC
  Administered 2017-07-22: 300 mg via ORAL
  Filled 2017-07-22: qty 1

## 2017-07-22 MED ORDER — KETOROLAC TROMETHAMINE 30 MG/ML IJ SOLN
15.0000 mg | Freq: Once | INTRAMUSCULAR | Status: AC
  Administered 2017-07-22: 15 mg via INTRAVENOUS
  Filled 2017-07-22: qty 1

## 2017-07-22 MED ORDER — SODIUM CHLORIDE 0.9 % IV BOLUS (SEPSIS)
1000.0000 mL | Freq: Once | INTRAVENOUS | Status: AC
  Administered 2017-07-22: 1000 mL via INTRAVENOUS

## 2017-07-22 MED ORDER — ACETAMINOPHEN 500 MG PO TABS
1000.0000 mg | ORAL_TABLET | Freq: Once | ORAL | Status: AC
  Administered 2017-07-22: 1000 mg via ORAL
  Filled 2017-07-22: qty 2

## 2017-07-22 NOTE — ED Triage Notes (Addendum)
Pt reports that his R lower molar broke off when he was eating this morning. He reports that the pain has been worsening throughout the day. He reports vomiting bile and blood PTA d/t pain. A&Ox4. Ambulatory. Tachy in the 130s in triage.

## 2017-07-22 NOTE — ED Provider Notes (Signed)
[facility]   CSN: [id number] Arrival date & time: 07/22/17  2055     History   Chief Complaint Chief Complaint  Patient presents with  . Dental Pain  . Tachycardia  . Emesis    HPI Jerry Gray is a 32 y.o. male.  The history is provided by the patient.  Dental Pain   This is a chronic problem. The current episode started more than 1 week ago. The problem occurs constantly. The problem has not changed since onset.The pain is at a severity of 10/10. The pain is severe. He has tried nothing for the symptoms. The treatment provided no relief.  Denies CP, SOB, n/v/d.  States heart rate is elevated secondary to pain.    Past Medical History:  Diagnosis Date  . ADHD (attention deficit hyperactivity disorder)   . Arthritis   . Depression   . Neuromuscular disorder (HCC)   . OCD (obsessive compulsive disorder)   . Seizures (HCC)   . Substance abuse (HCC)   . Suicidal thoughts     Patient Active Problem List   Diagnosis Date Noted  . Alcohol use disorder, severe, dependence (HCC) 12/26/2015  . 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 Polysubstance dependence including opioid type drug, episodic abuse (HCC) 12/23/2015  . Aggression   . Alcohol abuse 08/24/2015  . Elevated LFTs 08/24/2015  . Mass of forearm 08/24/2015    History reviewed. No pertinent surgical history.     Home Medications    Prior to Admission medications   Medication Sig Start Date End Date Taking? Authorizing Provider  chlorproMAZINE (THORAZINE) 25 MG tablet Take 25 mg by mouth daily as needed (anxiety).   Yes [provider]  clonazePAM (KLONOPIN) 1 MG tablet Take 1 mg by mouth 3 (three) times daily. 07/01/17  Yes [provider]  zolpidem (AMBIEN) 10 MG tablet Take 10 mg by mouth at bedtime as needed for  sleep.   Yes [provider]  nicotine (NICODERM CQ - DOSED IN MG/24 HOURS) 21 mg/24hr patch Place 1 patch (21 mg total) onto the skin daily. Patient not taking: Reported on 07/22/2017 12/26/15   Adonis BrookAgustin, Sheila, NP  traZODone (DESYREL) 50 MG tablet Take 1 tablet (50 mg total) by mouth at bedtime as needed for sleep. Patient not taking: Reported on 07/22/2017 12/26/15   Adonis BrookAgustin, Sheila, NP    Family History History reviewed. No pertinent family history.  Social History Social History   Tobacco Use  . Smoking status: Current Every Day Smoker    Packs/day: 1.50    Years: 10.00    Pack years: 15.00    Types: Cigarettes  Substance Use Topics  . Alcohol use: Yes    Alcohol/week: 96.6 oz    Types: 21 Cans of beer, 140 Shots of liquor per week    Comment: pt states a fifth a day  . Drug use: Yes    Frequency: 5.0 times per week    Types: Marijuana, Cocaine     Allergies   Ciprofloxacin and Penicillins   Review of Systems Review of Systems  HENT: Positive for dental problem. Negative for drooling and facial swelling.   All other systems reviewed and are negative.    Physical Exam Updated Vital Signs BP (!) 139/98 (BP Location: Left Arm)   Pulse (!) 121   Temp 99.5 F (37.5 C) (Oral)  Resp 20   SpO2 99%   Physical Exam  Constitutional: He is oriented to person, place, and time. He appears well-developed and well-nourished. No distress.  HENT:  Head: Normocephalic and atraumatic.  Nose: Nose normal.  Mouth/Throat: No oral lesions. No trismus in the jaw. Dental caries present. No dental abscesses or uvula swelling.  Eyes: Conjunctivae are normal. Pupils are equal, round, and reactive to light.  Horizontal nystagmus fatigueable  Neck: Normal range of motion. Neck supple. No tracheal deviation present.  Cardiovascular: Regular rhythm, normal heart sounds and intact distal pulses. Tachycardia present.  Pulmonary/Chest: Effort normal and breath sounds normal. No  stridor. He has no wheezes. He has no rales.  Abdominal: Soft. Bowel sounds are normal. He exhibits no mass. There is no tenderness. There is no rebound and no guarding.  Musculoskeletal: Normal range of motion.  Lymphadenopathy:    He has no cervical adenopathy.  Neurological: He is alert and oriented to person, place, and time. He displays normal reflexes.  Skin: Skin is warm and dry. Capillary refill takes less than 2 seconds.     ED Treatments / Results   Vitals:   07/22/17 2127 07/22/17 2248  BP: (!) 128/102 (!) 139/98  Pulse: (!) 133 (!) 121  Resp: 18 20  Temp: 98.2 F (36.8 C) 99.5 F (37.5 C)  SpO2: 99% 99%    Procedures Procedures (including critical care time)  Medications Ordered in ED Medications  sodium chloride 0.9 % bolus 1,000 mL (not administered)  ketorolac (TORADOL) 30 MG/ML injection 15 mg (not administered)  acetaminophen (TYLENOL) tablet 1,000 mg (not administered)  clindamycin (CLEOCIN) capsule 300 mg (not administered)       Final Clinical Impressions(s) / ED Diagnoses  Tachycardia is likely from his drug abuse. Stating nothing we have will work for his pain.  No signs of systemic infection.  Follow up with dentistry.   Return for worsening pain, vomiting blood inability to pass urine,  fevers > 100.4 unrelieved by medication, shortness of breath, intractable vomiting, or diarrhea, abdominal pain, Inability to tolerate liquids or food, cough, altered mental status or any concerns. No signs of systemic illness or infection. The patient is nontoxic-appearing on exam and vital signs are within normal limits.    I have reviewed the triage vital signs and the nursing notes. Pertinent labs &imaging results that were available during my care of the patient were reviewed by me and considered in my medical decision making (see chart for details).  After history, exam, and medical workup I feel the patient has been appropriately medically screened and is  safe for discharge home. Pertinent diagnoses were discussed with the patient. Patient was given return precautions.   Rumaisa Schnetzer, MD 07/22/17 2334

## 2017-07-23 MED ORDER — CLINDAMYCIN HCL 300 MG PO CAPS: 300.0000 mg | ORAL_CAPSULE | Freq: Four times a day (QID) | ORAL | 0 refills | Status: AC

## 2017-07-23 MED ORDER — NAPROXEN 375 MG PO TABS: 375.0000 mg | ORAL_TABLET | Freq: Two times a day (BID) | ORAL | 0 refills | Status: AC
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
## 2017-11-20 ENCOUNTER — Emergency Department (HOSPITAL_COMMUNITY)
Admission: EM | Admit: 2017-11-20 | Discharge: 2017-11-20 | Disposition: A | Discharge status: Home / Self Care | Payer: BLUE CROSS/BLUE SHIELD | Attending: Emergency Medicine | Admitting: Emergency Medicine

## 2017-11-20 ENCOUNTER — Emergency Department (HOSPITAL_COMMUNITY): Payer: BLUE CROSS/BLUE SHIELD

## 2017-11-20 ENCOUNTER — Encounter (HOSPITAL_COMMUNITY): Payer: Self-pay | Admitting: Emergency Medicine

## 2017-11-20 DIAGNOSIS — F1910 Other psychoactive substance abuse, uncomplicated: Secondary | ICD-10-CM

## 2017-11-20 DIAGNOSIS — R531 Weakness: Secondary | ICD-10-CM

## 2017-11-20 DIAGNOSIS — F1410 Cocaine abuse, uncomplicated: Secondary | ICD-10-CM | POA: Insufficient documentation

## 2017-11-20 DIAGNOSIS — F319 Bipolar disorder, unspecified: Secondary | ICD-10-CM | POA: Diagnosis not present

## 2017-11-20 DIAGNOSIS — F17210 Nicotine dependence, cigarettes, uncomplicated: Secondary | ICD-10-CM | POA: Diagnosis not present

## 2017-11-20 DIAGNOSIS — Z79899 Other long term (current) drug therapy: Secondary | ICD-10-CM | POA: Diagnosis not present

## 2017-11-20 DIAGNOSIS — R0789 Other chest pain: Secondary | ICD-10-CM | POA: Diagnosis present

## 2017-11-20 DIAGNOSIS — F1210 Cannabis abuse, uncomplicated: Secondary | ICD-10-CM | POA: Insufficient documentation

## 2017-11-20 LAB — MAGNESIUM: Magnesium: 1.9 mg/dL (ref 1.7–2.4)

## 2017-11-20 LAB — BASIC METABOLIC PANEL
ANION GAP: 12 (ref 5–15)
BUN: 24 mg/dL — ABNORMAL HIGH (ref 6–20)
CHLORIDE: 106 mmol/L (ref 101–111)
CO2: 22 mmol/L (ref 22–32)
Calcium: 9.5 mg/dL (ref 8.9–10.3)
Creatinine, Ser: 0.82 mg/dL (ref 0.61–1.24)
GFR calc Af Amer: >60 mL/min (ref >60)
GFR calc non Af Amer: >60 mL/min (ref >60)
GLUCOSE: 101 mg/dL — AB (ref 65–99)
POTASSIUM: 3.7 mmol/L (ref 3.5–5.1)
Sodium: 140 mmol/L (ref 135–145)

## 2017-11-20 LAB — HEPATIC FUNCTION PANEL
ALBUMIN: 4.3 g/dL (ref 3.5–5.0)
ALK PHOS: 57 U/L (ref 38–126)
ALT: 18 U/L (ref 17–63)
AST: 22 U/L (ref 15–41)
BILIRUBIN DIRECT: 0.1 mg/dL (ref 0.1–0.5)
BILIRUBIN INDIRECT: 0.8 mg/dL (ref 0.3–0.9)
BILIRUBIN TOTAL: 0.9 mg/dL (ref 0.3–1.2)
TOTAL PROTEIN: 7 g/dL (ref 6.5–8.1)

## 2017-11-20 LAB — ETHANOL

## 2017-11-20 LAB — I-STAT TROPONIN, ED
TROPONIN I, POC: 0.01 ng/mL (ref 0.00–0.08)
Troponin i, poc: 0.01 ng/mL (ref 0.00–0.08)

## 2017-11-20 LAB — CBC
HEMATOCRIT: 44.6 % (ref 39.0–52.0)
HEMOGLOBIN: 16 g/dL (ref 13.0–17.0)
MCH: 38.2 pg — AB (ref 26.0–34.0)
MCHC: 35.9 g/dL (ref 30.0–36.0)
MCV: 106.4 fL — ABNORMAL HIGH (ref 78.0–100.0)
Platelets: 239 10*3/uL (ref 150–400)
RBC: 4.19 MIL/uL — ABNORMAL LOW (ref 4.22–5.81)
RDW: 12.7 % (ref 11.5–15.5)
WBC: 7.4 10*3/uL (ref 4.0–10.5)

## 2017-11-20 NOTE — ED Notes (Signed)
Pt ambulated in hall without assistance. Pt did get off balance twice but was able to hold onto wall and steady himself

## 2017-11-20 NOTE — Patient Outreach (Signed)
ED Peer Support Specialist Patient Intake (Complete at intake & 30-60 Day Follow-up)  Name: Jerry Gray  MRN: [id number]  Age: 32 y.o.   Date of Admission: 11/20/2017  Intake: Initial Comments:      Primary Reason Admitted: Subvstance abuse   Lab values: Alcohol/ETOH: Negative Positive UDS? Yes(stated he has been using wippets) Amphetamines:   Barbiturates:   Benzodiazepines:   Cocaine: Yes Opiates:   Cannabinoids: Yes  Demographic information: Gender: Male Ethnicity: White Marital Status: Single Insurance Status: Private Insurance(BSBC) Ecologist (Work Neurosurgeon, Physicist, medical, etc.: No Lives with: Friend/Rommate Living situation: House/Apartment  Reported Patient History: Patient reported health conditions: Depression, Anxiety disorders, Bipolar disorder Patient aware of HIV and hepatitis status:    In past year, has patient visited ED for any reason? Yes  Number of ED visits: 3  Reason(s) for visit:    In past year, has patient been hospitalized for any reason? No  Number of hospitalizations:    Reason(s) for hospitalization:    In past year, has patient been arrested? Yes  Number of arrests:    Reason(s) for arrest:    In past year, has patient been incarcerated? No  Number of incarcerations:    Reason(s) for incarceration:    In past year, has patient received medication-assisted treatment? No  In past year, patient received the following treatments:    In past year, has patient received any harm reduction services? No  Did this include any of the following?    In past year, has patient received care from a mental health provider for diagnosis other than SUD? No  In past year, is this first time patient has overdosed? Yes(stated not bad enough to end his life. )  Number of past overdoses:    In past year, is this first time patient has been hospitalized for an overdose? No  Number of hospitalizations for  overdose(s):    Is patient currently receiving treatment for a mental health diagnosis? No  Patient reports experiencing difficulty participating in SUD treatment: No    Most important reason(s) for this difficulty?    Has patient received prior services for treatment? Yes(was seeing someone for family services. )  In past, patient has received services from following agencies: [facility])  Plan of Care:  Suggested follow up at these agencies/treatment centers: Other (comment)(wants a 90 substance detox program )  Other information: CPSS met with Pt to understand Pt ED visit. CPSS was made aware that Pt has been abusing nitrogen cartridges (wippets) as he stated. CPSS was made aware that Pt wants to get into 120 day treatment facility. CPSS was informed that Pts mother was researching some  Facilities and wants to work with [facility].    [name], [place]  11/20/2017 1:27 PM

## 2017-11-20 NOTE — ED Notes (Signed)
Pt given urinal and made aware of need for urine specimen 

## 2017-11-20 NOTE — ED Notes (Signed)
Pt given coke to drink and also reminded of need for urine specimen.

## 2017-11-20 NOTE — ED Triage Notes (Signed)
Pt states has weakness in legs due to nerve damage and when rushed out of apartment this morning to get to his doctor appt. Pt got down his stairs and felt increased weakness in his legs and sat down til roommate came and got him.  Pt reports then got back up stairs with roommates help then went outside to smoke a cigarette and started having chest pains with weird shooting tingling in bilat forearms.
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
## 2017-11-20 NOTE — ED Notes (Addendum)
Patient still says he's unable to urinate
